# Patient Record
Sex: Female | Born: 1985 | Race: Black or African American | Hispanic: No | Marital: Married | Smoking: Never smoker
Health system: Southern US, Community
[De-identification: ages and names within clinical notes are randomized; demographics above are authoritative.]

## PROBLEM LIST (undated history)

## (undated) ENCOUNTER — Inpatient Hospital Stay (HOSPITAL_COMMUNITY): Payer: Self-pay

## (undated) DIAGNOSIS — O24419 Gestational diabetes mellitus in pregnancy, unspecified control: Secondary | ICD-10-CM

## (undated) DIAGNOSIS — M549 Dorsalgia, unspecified: Secondary | ICD-10-CM

## (undated) HISTORY — PX: CHOLECYSTECTOMY: SHX55

---
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
## 2006-02-07 ENCOUNTER — Inpatient Hospital Stay (HOSPITAL_COMMUNITY): Admission: AD | Admit: 2006-02-07 | Discharge: 2006-02-07 | Payer: Self-pay | Admitting: Gynecology

## 2006-03-19 ENCOUNTER — Inpatient Hospital Stay (HOSPITAL_COMMUNITY): Admission: AD | Admit: 2006-03-19 | Discharge: 2006-03-19 | Payer: Self-pay | Admitting: Gynecology

## 2006-03-19 ENCOUNTER — Ambulatory Visit: Payer: Self-pay | Admitting: Obstetrics and Gynecology

## 2006-03-23 ENCOUNTER — Ambulatory Visit: Payer: Self-pay | Admitting: Obstetrics & Gynecology

## 2006-03-29 ENCOUNTER — Inpatient Hospital Stay (HOSPITAL_COMMUNITY): Admission: AD | Admit: 2006-03-29 | Discharge: 2006-03-29 | Payer: Self-pay | Admitting: Family Medicine

## 2006-03-29 ENCOUNTER — Ambulatory Visit: Payer: Self-pay | Admitting: *Deleted

## 2006-03-30 ENCOUNTER — Inpatient Hospital Stay (HOSPITAL_COMMUNITY): Admission: AD | Admit: 2006-03-30 | Discharge: 2006-04-04 | Payer: Self-pay | Admitting: Gynecology

## 2006-03-30 ENCOUNTER — Ambulatory Visit: Payer: Self-pay | Admitting: Obstetrics & Gynecology

## 2006-03-31 ENCOUNTER — Encounter (INDEPENDENT_AMBULATORY_CARE_PROVIDER_SITE_OTHER): Payer: Self-pay | Admitting: Specialist

## 2006-04-08 ENCOUNTER — Ambulatory Visit: Payer: Self-pay | Admitting: Gynecology

## 2006-05-13 ENCOUNTER — Inpatient Hospital Stay (HOSPITAL_COMMUNITY): Admission: EM | Admit: 2006-05-13 | Discharge: 2006-05-17 | Payer: Self-pay | Admitting: Emergency Medicine

## 2006-05-15 ENCOUNTER — Encounter (INDEPENDENT_AMBULATORY_CARE_PROVIDER_SITE_OTHER): Payer: Self-pay | Admitting: Specialist

## 2006-12-03 ENCOUNTER — Emergency Department (HOSPITAL_COMMUNITY): Admission: EM | Admit: 2006-12-03 | Discharge: 2006-12-03 | Payer: Self-pay | Admitting: Emergency Medicine

## 2007-06-28 ENCOUNTER — Emergency Department (HOSPITAL_COMMUNITY): Admission: EM | Admit: 2007-06-28 | Discharge: 2007-06-28 | Payer: Self-pay | Admitting: Emergency Medicine

## 2008-07-04 ENCOUNTER — Emergency Department (HOSPITAL_COMMUNITY): Admission: EM | Admit: 2008-07-04 | Discharge: 2008-07-05 | Payer: Self-pay | Admitting: Emergency Medicine

## 2008-08-13 ENCOUNTER — Inpatient Hospital Stay (HOSPITAL_COMMUNITY): Admission: AD | Admit: 2008-08-13 | Discharge: 2008-08-13 | Payer: Self-pay | Admitting: Obstetrics & Gynecology

## 2008-09-14 ENCOUNTER — Ambulatory Visit (HOSPITAL_COMMUNITY): Admission: RE | Admit: 2008-09-14 | Discharge: 2008-09-14 | Payer: Self-pay | Admitting: Obstetrics and Gynecology

## 2008-10-28 ENCOUNTER — Inpatient Hospital Stay (HOSPITAL_COMMUNITY): Admission: AD | Admit: 2008-10-28 | Discharge: 2008-10-28 | Payer: Self-pay | Admitting: Obstetrics & Gynecology

## 2008-10-28 ENCOUNTER — Ambulatory Visit: Payer: Self-pay | Admitting: Family

## 2009-02-08 ENCOUNTER — Ambulatory Visit: Payer: Self-pay | Admitting: Obstetrics & Gynecology

## 2009-02-08 ENCOUNTER — Inpatient Hospital Stay (HOSPITAL_COMMUNITY): Admission: AD | Admit: 2009-02-08 | Discharge: 2009-02-10 | Payer: Self-pay | Admitting: Obstetrics & Gynecology

## 2010-07-06 ENCOUNTER — Emergency Department (HOSPITAL_COMMUNITY)
Admission: EM | Admit: 2010-07-06 | Discharge: 2010-07-07 | Payer: Self-pay | Source: Home / Self Care | Admitting: Emergency Medicine

## 2010-07-07 NOTE — L&D Delivery Note (Signed)
Delivery Note At 8:09 PM a viable female was delivered via Vaginal, Spontaneous Delivery (Presentation: occiput anterior direct).  APGAR: 7 ,9 ; weight 9 lb 9.4 oz (4350 g).   Placenta status: delivered intact.  Cord: 3 vessel cord.  with the following complications: Mild shoulder dystocia due to transverse position of shoulders and fetal macrosomia.  [place] employed, shoulders released by delivery of anterior arm with posterior traction on elbow. Pubic rami obstructing release of shoulder, not pubic symphysis.   Anesthesia: Epidural  Episiotomy: none Lacerations: 1st degree perineal, no repair required.  Est. Blood Loss (mL): 250  Mom to postpartum.  Baby to nursery-stable.  Delivery attended by Dr. Emelda Fear.   Tana Conch 05/28/2011, 8:28 PM

## 2010-07-07 NOTE — L&D Delivery Note (Signed)
Deluivery attended and active assistance with shoulder dystocia performed, with good outcomes.

## 2010-09-16 LAB — DIFFERENTIAL
Basophils Relative: 0 % (ref 0–1)
Lymphocytes Relative: 49 % — ABNORMAL HIGH (ref 12–46)
Lymphs Abs: 3.6 10*3/uL (ref 0.7–4.0)
Monocytes Absolute: 0.5 10*3/uL (ref 0.1–1.0)
Neutrophils Relative %: 41 % — ABNORMAL LOW (ref 43–77)

## 2010-09-16 LAB — URINE MICROSCOPIC-ADD ON

## 2010-09-16 LAB — COMPREHENSIVE METABOLIC PANEL
CO2: 25 mEq/L (ref 19–32)
Chloride: 108 mEq/L (ref 96–112)
Creatinine, Ser: 0.57 mg/dL (ref 0.4–1.2)
GFR calc Af Amer: >60 mL/min (ref >60)
GFR calc non Af Amer: >60 mL/min (ref >60)
Glucose, Bld: 93 mg/dL (ref 70–99)
Sodium: 140 mEq/L (ref 135–145)
Total Bilirubin: 0.8 mg/dL (ref 0.3–1.2)

## 2010-09-16 LAB — URINALYSIS, ROUTINE W REFLEX MICROSCOPIC
Bilirubin Urine: NEGATIVE
Leukocytes, UA: NEGATIVE
Nitrite: NEGATIVE
Protein, ur: NEGATIVE mg/dL
Urobilinogen, UA: 1 mg/dL (ref 0.0–1.0)

## 2010-09-16 LAB — CBC: MCH: 28.2 pg (ref 26.0–34.0)

## 2010-10-12 LAB — CBC
MCV: 88.8 fL (ref 78.0–100.0)
Platelets: 232 10*3/uL (ref 150–400)
RBC: 4.27 MIL/uL (ref 3.87–5.11)

## 2010-10-12 LAB — RPR: RPR Ser Ql: NONREACTIVE

## 2010-10-31 ENCOUNTER — Inpatient Hospital Stay (HOSPITAL_COMMUNITY)
Admission: AD | Admit: 2010-10-31 | Discharge: 2010-10-31 | Disposition: A | Discharge status: Home / Self Care | Payer: Medicaid Other | Source: Ambulatory Visit | Attending: Obstetrics & Gynecology | Admitting: Obstetrics & Gynecology

## 2010-10-31 DIAGNOSIS — O210 Mild hyperemesis gravidarum: Secondary | ICD-10-CM | POA: Insufficient documentation

## 2010-10-31 LAB — URINALYSIS, ROUTINE W REFLEX MICROSCOPIC
Bilirubin Urine: NEGATIVE
Hgb urine dipstick: NEGATIVE
Specific Gravity, Urine: >1.03 — ABNORMAL HIGH (ref 1.005–1.030)
pH: 7 (ref 5.0–8.0)

## 2010-11-06 ENCOUNTER — Other Ambulatory Visit: Payer: Self-pay | Admitting: Family Medicine

## 2010-11-06 DIAGNOSIS — Z3689 Encounter for other specified antenatal screening: Secondary | ICD-10-CM

## 2010-11-06 LAB — OB RESULTS CONSOLE VARICELLA ZOSTER ANTIBODY, IGG: Varicella: IMMUNE

## 2010-11-06 LAB — SICKLE CELL SCREEN: SICKLE CELL SCREEN: NORMAL

## 2010-11-18 ENCOUNTER — Inpatient Hospital Stay (HOSPITAL_COMMUNITY)
Admission: AD | Admit: 2010-11-18 | Discharge: 2010-11-18 | Disposition: A | Discharge status: Home / Self Care | Payer: Medicaid Other | Source: Ambulatory Visit | Attending: Family Medicine | Admitting: Family Medicine

## 2010-11-18 DIAGNOSIS — O210 Mild hyperemesis gravidarum: Secondary | ICD-10-CM | POA: Insufficient documentation

## 2010-11-18 LAB — URINALYSIS, ROUTINE W REFLEX MICROSCOPIC
Hgb urine dipstick: NEGATIVE
Protein, ur: NEGATIVE mg/dL
Specific Gravity, Urine: >1.03 — ABNORMAL HIGH (ref 1.005–1.030)
Urobilinogen, UA: 0.2 mg/dL (ref 0.0–1.0)
pH: 8 (ref 5.0–8.0)

## 2010-11-22 NOTE — Op Note (Signed)
NAME:  [name], [name] NO.:  [id number]   MEDICAL RECORD NO.:  [id number]          PATIENT TYPE:  INP   LOCATION:  5735                         FACILITY:  [facility]   PHYSICIAN:  Angelia Mould. Derrell Lolling, M.D.DATE OF BIRTH:  Apr 21, 1986   DATE OF PROCEDURE:  05/15/2006  DATE OF DISCHARGE:                                 OPERATIVE REPORT   PREOPERATIVE DIAGNOSIS:  Gallstone pancreatitis.   POSTOPERATIVE DIAGNOSIS:  Gallstone pancreatitis, suspect  choledocholithiasis.   OPERATION PERFORMED:  Laparoscopic cholecystectomy with intraoperative  cholangiogram.   SURGEON:  Dr. Claud Kelp   FIRST ASSISTANT:  Dr. Violeta Gelinas   OPERATIVE INDICATIONS:  This is a 25 year old African female who had a  recent cesarean section about 6 weeks ago.  She was admitted on November7  with abdominal pain, significantly elevated amylase, and elevated liver  function tests and gallstones.  She is admitted by Dr. Vida Rigger.  The  pancreatitis and her pain rapidly improved to the point where this morning  she was completely asymptomatic and had a nontender abdomen.  Her amylase  and lipase had come down greatly.  Liver function tests had come down  somewhat but are not completely normal.  We discussed algorithms for care  and decided to go ahead with cholecystectomy.   OPERATIVE FINDINGS:  The gallbladder was edematous and somewhat acutely  inflamed.  The cholangiogram showed normal intrahepatic and extrahepatic  biliary anatomy.  The cystic duct was quite long with a low insertion.  There were no filling defects that were obvious, but the contrast did not go  into the duodenum. The common duct did not drain despite a second  cholangiogram after giving some Glucagon.  A common bile duct stone could  not be ruled out.  The liver, stomach, duodenum, large intestine, small  intestine were grossly normal to inspection.  She had significant adhesions  in the lower midline from her previous  C-section.   OPERATIVE TECHNIQUE:  Following the induction of general endotracheal  anesthesia, the patient's abdomen was prepped and draped in a sterile  fashion.  Marcaine 0.5% with epinephrine was used as a local infiltration  anesthetic.  A vertically oriented incision was made in the lower rim of the  umbilicus.  The fascia was incised in the midline and the abdominal cavity  entered under direct vision.  A 10 mm Hassan trocar was inserted and secured  with a pursestring suture of 0 Vicryl.  Pneumoperitoneum was created.  Video  camera was inserted with visualization and findings as described above.  A  10-mm trocar was placed in the subxiphoid region.  Two 5 mm trocars were  placed in the right mid abdomen.  The gallbladder fundus was identified.  We  carefully took down lots of adhesions from the gallbladder until we could  identify the infundibulum.  We then retracted the infundibulum laterally.  We then dissected out the cystic duct and cystic artery.  The cystic artery  was isolated as it went onto the wall of the gallbladder, secured with  multiple metal clips, and divided.  We then created a large window behind  the cystic duct.  A cholangiogram catheter was inserted into the cystic  duct.  A cholangiogram was then obtained and showed normal anatomy, but  there was no drainage suggesting obstruction of the distal common bile duct,  otherwise normal biliary anatomy.  The cholangiogram catheter was removed.  The cystic duct was secured with multiple metal clips and divided.  The  gallbladder was dissected from its bed with electrocautery and removed  through the umbilical port.  The operative field was irrigated.  Hemostasis  was excellent.  At the completion of the case, there was no bleeding and no  bile leak whatsoever.  The trocars were removed under direct vision.  There  was no bleeding from the trocar sites.  Pneumoperitoneum was released.  The  fascia at the umbilicus was  closed with 0 Vicryl suture.  Skin incisions  were closed with subcuticular sutures of 4-0 Monocryl and Steri-Strips.  Clean bandages were placed and the patient taken to the recovery room in  stable condition.  Estimated blood loss was about 10 mL.  Complications  none.  Sponge and instrument counts were correct.      Angelia Mould. Derrell Lolling, M.D.  Electronically Signed     HMI/MEDQ  D:  05/15/2006  T:  05/15/2006  Job:  [id number]   cc:   [name], M.D.

## 2010-11-22 NOTE — Discharge Summary (Signed)
NAMEMarland Kitchen  MARSI, TURVEY NO.:  [id number]   MEDICAL RECORD NO.:  [id number]          PATIENT TYPE:  INP   LOCATION:  9138                          [facility]AC[facility]  [facility]   PHYSICIAN:  Ginger Carne, MD  DATE OF BIRTH:  January 30, 1986   DATE OF ADMISSION:  03/30/2006  DATE OF DISCHARGE:  04/04/2006                               DISCHARGE SUMMARY   DELIVERY DATE:  March 31, 2006.   DISCHARGE DIAGNOSES:  1. Status post low transverse cesarean section for arrest of second      stage of labor.  2. Chorioamnionitis and endometritis.  3. Epigastric pain.  4. History of Trichomonas in March 2007 that was treated.   ADMIT HISTORY AND PHYSICAL:  The patient is a 25 year old female, G1,  who presented at 70 and 4/7 with contractions occurring every 5 minutes  who was admitted for spontaneous onset of labor with dilation from 2-3  cm to 3 cm after ambulating in the hall.   HOSPITAL COURSE:  #1.  Status post low transverse cesarean section. The  patient was admitted with spontaneous onset of labor after she was going  to have an induction of labor on the evening of admission. She required  Pitocin augmentation to ripen cervix and to dilate it and artifical  rupture of membranes for progression. The patient dilated to complete  and attempted to push but could not have any feedback because she had  epidural placed and then stated that it hurt too much to push as the  baby was occiput posterior position. She was consented for a C section  for arrest of the second stage of labor. She was taken back to the  operating room where she delivered a healthy female infant born at 9  pounds 8 ounces with a length of 54 cm. There was a three-vessel cord.  Cord pH was obtained. The patient's estimated blood loss was 1500 mL.  Her hemoglobin had dropped to 10.7 from 13.1 on the day after C section  but then it stabilized after that. She is rubella immune, Rh positive,  GBS negative. She is to  have sutures out at 5-7 days and have a 6 week  postpartum visit. The C section was complicated by uterine atony and  required uterine massage.  #2.  Chorioamnionitis and endometritis. The patient was noted to have a  temperature of up to 100.9 and the baby's fetal heart rate went from a  baseline of 140s to baseline of 155 so __________  and clindamycin were  started. She was monitored through the few days postpartum and was noted  to spike a temperature on postoperative day #1 and also have a  temperature on postoperative day #2 but was noted to be afebrile in the  48-hours leading up to discharge. She was discontinued on her IV  antibiotics at that time and discharged on Augmentin 500 mg b.i.d. x10  days. Blood cultures were drawn and had no growth. Urine cultures also  had no growth.  #3.  Epigastric pain. The patient was noted to have epigastric pain on  postoperative day #3. She had had  a bowel movement and was urinating. A  CMP, CBC, lipase, and amylase were ordered with repeat serial abdominal  exams. __________ considered right upper quadrant ultrasound or a CT.  CMP, lipase and amylase were within normal limits. She did not have a  left shift on her CBC. She was without rebound or guarding later in  postoperative day #3 and this seemed to resolve on its own.   DISCHARGE MEDICATIONS:  1. Augmentin 500 mg b.i.d. x10 days.  2. Micronor for contraception.  3. Prenatal vitamin to take while breast feeding.  4. Colace 100 mg p.r.n. daily for constipation.   CONDITION ON DISCHARGE:  Good, stable.     ______________________________  Norton Blizzard, M.D.      Ginger Carne, MD  Electronically Signed    SH/MEDQ  D:  06/11/2006  T:  06/11/2006  Job:  [id number]

## 2010-11-22 NOTE — Discharge Summary (Signed)
NAME:  [name], [name] NO.:  [id number]   MEDICAL RECORD NO.:  [id number]          PATIENT TYPE:  INP   LOCATION:  5735                         FACILITY:  [facility]   PHYSICIAN:  Angelia Mould. Derrell Lolling, M.D.DATE OF BIRTH:  Feb 22, 1986   DATE OF ADMISSION:  05/13/2006  DATE OF DISCHARGE:  05/17/2006                               DISCHARGE SUMMARY   FINAL DIAGNOSES:  1. Gallstone pancreatitis.  2. Chronic cholecystitis with cholelithiasis, pathology report      pending.  3. Status post cesarean section 6 months prior.   OPERATIONS PERFORMED:  Laparoscopic cholecystectomy with intraoperative  cholangiogram, date May 15, 2006.   HISTORY:  This is a 25 year old female who presented to the emergency  room with abdominal pain, nausea and vomiting.  Workup revealed  hemoglobin 12.5, blood cell count 7900, SGOT 56, SGPT 43, total  bilirubin 2.7 and lipase greater than 2000.  She was thought to have  gallstone pancreatitis.  Ultrasound demonstrated gallstones.  The  emergency department physician called Dr. Jimmye Norman who evaluated the  patient and admitted the patient.   PHYSICAL EXAMINATION:  Pleasant in mild distress, young woman.  Afebrile, pulse 73.  Sclera revealed no evidence of jaundice.  Abdomen was soft with some mild to moderate tenderness in the right  upper quadrant and a questionable palpable mass on the right side.  No  organomegaly.   HOSPITAL COURSE:  The patient was admitted by Dr. Jimmye Norman.  Dr.  Lindie Spruce call Dr. Evette Cristal to consider preoperative ERCP.  The patient was  admitted for further evaluation and management.   Dr. Vida Rigger followed up with the patient and felt that since her  symptoms were improving that he would observe the patient, and that if  her pain and liver function tests and amylase and lipase declined over  24 hours he would prefer [place] to go ahead with the cholecystectomy and  hold off on the ERCP unless necessary.   Over the next  couple days, the patient's pain did get much better.  The  amylase on May 14, 2006 was down to 442, and the total bilirubin was  down to 1.9.  She was kept n.p.o.  On November, the patient was pain  free and hungry, and the abdomen exam was unremarkable.  Laparoscopic  cholecystectomy with cholangiogram was performed on November 9.  The  common bile duct did not empty despite the use of glucagon, but no  filling defects were noted.  Cholecystectomy was uneventful.   Over the next couple of days, the patient was followed closely by Dr.  Carman Ching of the GI service as well as Dr. Leonie Man of [facility].  The patient did well.  Her liver function tests  declined.  She resumed diet.  Dr. Dorena Cookey saw her on May 17, 2006 and felt that since she was improving that he would hold off on  ERCP.  He stated that if the liver function tests do not normalize over  time that she would probably need a workup for chronic hepatitis B and  C.  The patient was  also seen by Dr. Leonie Man of [facility] who discharged her home with follow-up in our office in 2 weeks.      Angelia Mould. Derrell Lolling, M.D.  Electronically Signed     HMI/MEDQ  D:  07/24/2006  T:  07/24/2006  Job:  [id number]   cc:   Petra Kuba, M.D.

## 2010-11-22 NOTE — Op Note (Signed)
NAME:  [name], [name] NO.:  [id number]   MEDICAL RECORD NO.:  [id number]          PATIENT TYPE:  INP   LOCATION:  9138                          FACILITY:  [facility]   PHYSICIAN:  Lesly Dukes, M.D. DATE OF BIRTH:  11/18/1985   DATE OF PROCEDURE:  03/31/2006  DATE OF DISCHARGE:                                 OPERATIVE REPORT   The patient is a 25 year old G1 P0 female at 70 and 4 with arrest of second  stage, occiput posterior presentation, chorioamnionitis, and meconium.   POSTOPERATIVE DIAGNOSIS:  1. The patient is a 25 year old G1 P0 female at 91 and 4 with arrest of      second stage, occiput posterior presentation, chorioamnionitis, and      meconium.  2. Uterine atony.   PROCEDURE:  Primary low transverse cesarean section.   SURGEON:  Lesly Dukes, M.D.   ASSISTANT:  Ginger Carne, M.D.   ANESTHESIA:  Epidural.   PATHOLOGY:  Placenta to pathology.   ESTIMATED BLOOD LOSS:  1500.   COMPLICATIONS:  None.   FINDINGS:  A viable female infant.  Apgars 9 at one minute, 9 at five  minutes, weighing 9 pounds 8 ounces.  Deflexed occiput posterior  presentation.  Meconium.  Arterial blood gas 7.22.  Grossly normal uterus,  ovaries, and fallopian tubes.  Uterine atony that responded to Methergine,  Hemabate, and Cytotec.   PROCEDURE:  After informed consent was obtained, the patient was taken to  the operating room, where epidural anesthesia was found to be adequate.  The  patient was placed in the dorsal supine position with a leftward tilt and  prepared and draped in the normal sterile fashion.  A Foley was placed in  the bladder.  A Pfannenstiel skin incision was made with a scalpel and  carried down to the fascia.  The fascia was incised in the midline and  extended bilaterally.  The superior and inferior aspects of the fascial  incision were grasped with Kocher clamps, tented up, and dissected down  sharply and bluntly from the underlying  layers of rectus muscles.  Rectus  muscles were separated in the midline.  The peritoneum was entered bluntly,  and this incision was extended both superiorly and inferiorly, with good  visualization of the bladder.  The bladder blade was inserted.  The bladder  flap was then created, and the bladder blade was then reinserted.  Uterine  incision was made in a transverse fashion in the lower uterine segment and  was extended with the bandage scissors.  The baby's head delivered  atraumatically, and the nose and mouth were suctioned.  The rest of the  baby's body delivered easily.  Cord was clamped and cut, and the baby was  handed off to the awaiting pediatrician.  Arterial cord blood was sent for  blood gas.  Placenta delivered manually and was sent to pathology.  The  uterus was exteriorized and cleared out of all clots and debris.  Vigorous  massage and Pitocin were given.  Methergine was also given for uterine  atony.  The uterus did not respond, and Hemabate  was given.  More massage  was given, and eventually Cytotec was given in the rectum, and eventually  good uterine contraction was seen.  The uterine incision was closed with 0  Vicryl in a running-locked fashion, with good hemostasis.  The uterus was  returned to the abdomen and noted to be hemostatic off tension.  The  peritoneum and rectus muscles were noted to be hemostatic.  The fascia was  closed with 0 Vicryl in a running fashion and noted to be hemostatic.  Subcutaneous tissue was copiously irrigated and found to be hemostatic.  The  skin was closed with staples.  The patient tolerated the procedure well.  The sponge, lap, instrument, and needle counts were correct x2.  The patient  went to the recovery room in stable condition.  Antibiotics were given for  24 hours postpartum, given the chorioamnionitis.           ______________________________  Lesly Dukes, M.D.     KHL/MEDQ  D:  03/31/2006  T:  04/02/2006  Job:   [id number]

## 2010-11-22 NOTE — H&P (Signed)
NAME:  [name], [name] NO.:  [id number]   MEDICAL RECORD NO.:  [id number]          PATIENT TYPE:  WOC   LOCATION:  WOC                          FACILITY:  [facility]   PHYSICIAN:  Cherylynn Ridges, M.D.    DATE OF BIRTH:  Jul 28, 1985   DATE OF ADMISSION:  04/08/2006  DATE OF DISCHARGE:  04/08/2006                                HISTORY & PHYSICAL   IDENTIFICATION/CHIEF COMPLAINT:  The patient is a 25 year old female who has  been in the emergency room since 1 o'clock this afternoon with severe  abdominal pain and was found to have gallstone pancreatitis.   HISTORY OF PRESENT ILLNESS:  The patient was in her usual state of health.  Developed severe abdominal pain in mid epigastrium and right upper quadrant  associated with nausea and some vomiting.  She had no fevers or chills, but  the pain was 10/10 on her arrival to the emergency room.  She is afebrile.  Subsequent workup demonstrated her to have a hemoglobin 12.5, hematocrit  36.4, normal white blood cell count of 7.9, SGOT of 56, SGPT 43, alk phos  254, with total bilirubin of 2.7.  Lipase was over 2000.  She was found to  have gallstone pancreatitis.  An ultrasound demonstrated gallstones, and a  surgical consultation was obtained.   Dr. Evette Cristal contacted me when the gallstone pancreatitis was made because of  the elevated bilirubin and another elevated liver function test I thought it  was prudent to get a GI consultation and consider preoperative ERCP prior to  surgical intervention.  Dr. Vida Rigger came in to see the patient and  thought her symptoms are markedly improved, although she had received  parenteral narcotics.  Above that the likelihood that she had already passed  the stone and that she will likely not require an ERCP.  I came in to admit  the patient with the possibility of performing surgery tomorrow.   PAST MEDICAL HISTORY:  Unremarkable.   PAST SURGICAL HISTORY:  She had a C-section six months ago  and has a 1-month-  old little girl.   MEDICATIONS:  She takes no medications currently, outside of Tylenol, for  this pain.   ALLERGIES:  She has NO KNOWN DRUG ALLERGIES.   SOCIAL HISTORY:  She is a nonsmoker and nondrinker.  Does not take any  illegal drugs.   FAMILY HISTORY:  Noncontributory.   REVIEW OF SYSTEMS:  She has had nausea, vomiting, and abdominal pain.   PHYSICAL EXAMINATION:  GENERAL:  She is afebrile.  VITAL SIGNS:  Blood pressure 111/76.  Pulse of 73.  She has a normal O2 sat  of 100% on room air.  HEENT:  She is normocephalic, atraumatic, and nonicteric.  She does not have  any scleral jaundice.  NECK:  Supple.  CHEST:  Clear to auscultation.  CARDIAC:  Regular rhythm and rate without murmurs, gallops, rubs, or heaves.  ABDOMEN:  Soft with some mild-to-moderate tenderness in right upper quadrant  and a questionable palpable mass over on the right side.  No splenomegaly or  palpable spleen.  PELVIC AND RECTAL:  Not  performed.  NEUROLOGIC:  Cranial nerves II-XII are grossly intact.   LABORATORY DATA:  Checking her laboratory studies other than those I  mentioned previously, her electrolytes are within normal limits with normal  potassium.  BUN is 6 and creatinine of 0.5.   IMPRESSION:  Gallstone pancreatitis with the possibility of retained bile-  duct stone.  Because of the possibility of retained stone, I felt as though  it was a good idea to get a GI consult.  They felt as though if the LFTs  improved by tomorrow then an ERCP would not be necessary preoperatively.  We  will go ahead admit the patient on the service, repeat her labs tomorrow  morning, and hopefully get her gallbladder out tomorrow.      Cherylynn Ridges, M.D.  Electronically Signed     JOW/MEDQ  D:  05/13/2006  T:  05/14/2006  Job:  [id number]   cc:   Petra Kuba, M.D.

## 2010-11-22 NOTE — Consult Note (Signed)
NAME:  MAJESTIC, MOLONY NO.:  [id number]   MEDICAL RECORD NO.:  [id number]          PATIENT TYPE:  INP   LOCATION:  5735                         FACILITY:  [facility]   PHYSICIAN:  Petra Kuba, M.D.    DATE OF BIRTH:  1985/12/15   DATE OF CONSULTATION:  05/13/2006  DATE OF DISCHARGE:                                   CONSULTATION   HISTORY:  Patient seen at the request of the ER physician, Dr. Normajean Glasgow, for  presumed gallstone pancreatitis.  They had called the surgeon, Dr. Jimmye Norman, who had asked that we be called.  She had no previous GI history.  Did have a baby and C-section, although no preop ultrasound 6 weeks ago, has  been breastfeeding some, but yesterday began having abdominal pain right  upper quadrant, some radiation to her back with some nausea and vomiting and  possibly a low grade fever.  She did notice her urine was dark today, but  has had no previous GI problems or any other complaints.  She did present to  the emergency room.  Ultrasound did show gallstones with some cholecystitis  and some pericolic fluid.  Her CBD was slightly dilated.  Her pancreas  looked good on ultrasound.  Her lipase was greater than 2000.  Liver tests  elevated with a normal white count and although she is feeling better is  admitted for further workup and plan.   PAST MEDICAL HISTORY:  Is pertinent for this recent C-section as above only.   ALLERGIES:  NONE.   CURRENT MEDICATIONS:  Tylenol only.   SOCIAL HISTORY:  Rarely drinks.  Does not smoke.  Does not use any other  over-the-counter medicines.   FAMILY HISTORY:  Negative for any GI problems or gallbladder problems.   REVIEW OF SYSTEMS:  Pertinent for being hungry, wants to eat.  She has lost  some weight since her pregnancy, but really has not quantitated it.   PHYSICAL EXAMINATION:  VITAL SIGNS:  See chart.  She is afebrile.  GENERAL:  In no acute distress.  LUNGS:  Clear.  HEART:  Regular rate and rhythm.  ABDOMEN:  Soft.  She is not tender currently.   Urinalysis pertinent for a moderate amount of bilirubin.  Specific gravity  normal, 3 to 6 white cells, nitrates and leukocyte esterase negative.  White  count normal, hemoglobin normal, platelet count normal with a normal  differential.   LABS:  Pertinent for a normal BUN and creatinine.  Normal protein and  albumin.  SGOT 560, SGPT 403, alkaline phosphatase 254, total bili 2.7 with  a lipase greater than 2000.  Ultrasound reviewed with Dr. Eppie Gibson, pertinent  for above findings.   ASSESSMENT:  1. Recent C-section.  2. Gallstone pancreatitis.  3. Acute cholecystitis on the ultrasound.   PLAN:  I believe she has passed the stone based on feeling better, hungry  and nontender and her pancreas looking good on ultrasound, tells me that she  might be a good candidate, that if her labs and exam are even better  tomorrow, possibly a quick lap chole with intraoperative cholangiogram might  be in her best interests.  We have discussed pre- versus postop ERCP with  the patient.  She does not like the idea of something going through her  mouth.  We talked about how she would need surgery and I have asked a  surgeon to come see her.  Certainly, if her liver tests are increasing,  there are signs of cholangitis, worsening pancreatitis, may need preop ERCP  first.  Dr. Bosie Clos from our service to check on in the a.m. and we can  discuss how to proceed further.  Either happy to admit to our service or if  Dr. Lindie Spruce, we can admit them to their service and I believe Dr. Derrell Lolling is  their hospital doctor and we can get his opinion in the a.m. as to how to  proceed.           ______________________________  Petra Kuba, M.D.     MEM/MEDQ  D:  05/13/2006  T:  05/14/2006  Job:  [id number]   cc:   Cherylynn Ridges, M.D.

## 2010-12-25 ENCOUNTER — Ambulatory Visit (HOSPITAL_COMMUNITY)
Admission: RE | Admit: 2010-12-25 | Discharge: 2010-12-25 | Disposition: A | Discharge status: Home / Self Care | Payer: Medicaid Other | Source: Ambulatory Visit | Attending: Family Medicine | Admitting: Family Medicine

## 2010-12-25 DIAGNOSIS — Z3689 Encounter for other specified antenatal screening: Secondary | ICD-10-CM

## 2010-12-25 DIAGNOSIS — Z363 Encounter for antenatal screening for malformations: Secondary | ICD-10-CM | POA: Insufficient documentation

## 2010-12-25 DIAGNOSIS — Z1389 Encounter for screening for other disorder: Secondary | ICD-10-CM | POA: Insufficient documentation

## 2010-12-25 DIAGNOSIS — O358XX0 Maternal care for other (suspected) fetal abnormality and damage, not applicable or unspecified: Secondary | ICD-10-CM | POA: Insufficient documentation

## 2011-04-11 LAB — URINE CULTURE: Colony Count: 45000

## 2011-04-11 LAB — URINE MICROSCOPIC-ADD ON

## 2011-04-11 LAB — URINALYSIS, ROUTINE W REFLEX MICROSCOPIC
Ketones, ur: NEGATIVE mg/dL
Urobilinogen, UA: 1 mg/dL (ref 0.0–1.0)
pH: 8 (ref 5.0–8.0)

## 2011-05-23 ENCOUNTER — Other Ambulatory Visit (HOSPITAL_COMMUNITY): Payer: Self-pay | Admitting: Nurse Practitioner

## 2011-05-23 DIAGNOSIS — O480 Post-term pregnancy: Secondary | ICD-10-CM

## 2011-05-28 ENCOUNTER — Ambulatory Visit (HOSPITAL_COMMUNITY): Payer: Medicaid Other

## 2011-05-28 ENCOUNTER — Encounter (HOSPITAL_COMMUNITY): Payer: Self-pay | Admitting: Anesthesiology

## 2011-05-28 ENCOUNTER — Inpatient Hospital Stay (HOSPITAL_COMMUNITY): Payer: Medicaid Other | Admitting: Anesthesiology

## 2011-05-28 ENCOUNTER — Encounter (HOSPITAL_COMMUNITY): Payer: Self-pay

## 2011-05-28 ENCOUNTER — Inpatient Hospital Stay (HOSPITAL_COMMUNITY)
Admission: AD | Admit: 2011-05-28 | Discharge: 2011-05-30 | DRG: 775 | Disposition: A | Discharge status: Home / Self Care | Payer: Medicaid Other | Source: Ambulatory Visit | Attending: Obstetrics and Gynecology | Admitting: Obstetrics and Gynecology

## 2011-05-28 LAB — CBC
HCT: 37 % (ref 36.0–46.0)
Hemoglobin: 12.9 g/dL (ref 12.0–15.0)
MCH: 30.4 pg (ref 26.0–34.0)
MCHC: 34.9 g/dL (ref 30.0–36.0)
MCV: 87.1 fL (ref 78.0–100.0)
Platelets: 248 K/uL (ref 150–400)
RBC: 4.25 MIL/uL (ref 3.87–5.11)
RDW: 14.3 % (ref 11.5–15.5)
WBC: 6.1 K/uL (ref 4.0–10.5)

## 2011-05-28 LAB — ABO/RH: RH Type: POSITIVE

## 2011-05-28 LAB — ANTIBODY SCREEN: Antibody Screen: NEGATIVE

## 2011-05-28 LAB — HEPATITIS B SURFACE ANTIGEN: Hepatitis B Surface Ag: NEGATIVE

## 2011-05-28 LAB — RPR: RPR Ser Ql: NONREACTIVE

## 2011-05-28 LAB — RUBELLA ANTIBODY, IGM: Rubella: IMMUNE

## 2011-05-28 MED ORDER — PRENATAL PLUS 27-1 MG PO TABS
1.0000 | ORAL_TABLET | Freq: Every day | ORAL | Status: DC
  Administered 2011-05-29 – 2011-05-30 (×2): 1 via ORAL
  Filled 2011-05-28 (×2): qty 1

## 2011-05-28 MED ORDER — DIPHENHYDRAMINE HCL 25 MG PO CAPS: 25.0000 mg | ORAL_CAPSULE | Freq: Four times a day (QID) | ORAL | Status: DC | PRN

## 2011-05-28 MED ORDER — BENZOCAINE-MENTHOL 20-0.5 % EX AERO
1.0000 "application " | INHALATION_SPRAY | CUTANEOUS | Status: DC | PRN
  Filled 2011-05-28: qty 56

## 2011-05-28 MED ORDER — LACTATED RINGERS IV SOLN: 500.0000 mL | Freq: Once | INTRAVENOUS | Status: DC

## 2011-05-28 MED ORDER — SIMETHICONE 80 MG PO CHEW: 80.0000 mg | CHEWABLE_TABLET | ORAL | Status: DC | PRN

## 2011-05-28 MED ORDER — LACTATED RINGERS IV SOLN: 500.0000 mL | INTRAVENOUS | Status: DC | PRN

## 2011-05-28 MED ORDER — ACETAMINOPHEN 325 MG PO TABS: 650.0000 mg | ORAL_TABLET | ORAL | Status: DC | PRN

## 2011-05-28 MED ORDER — OXYTOCIN 10 UNIT/ML IJ SOLN
INTRAMUSCULAR | Status: AC
  Filled 2011-05-28: qty 2

## 2011-05-28 MED ORDER — ONDANSETRON HCL 4 MG PO TABS: 4.0000 mg | ORAL_TABLET | ORAL | Status: DC | PRN

## 2011-05-28 MED ORDER — NALBUPHINE SYRINGE 5 MG/0.5 ML: 10.0000 mg | INJECTION | INTRAMUSCULAR | Status: DC | PRN

## 2011-05-28 MED ORDER — PHENYLEPHRINE 40 MCG/ML (10ML) SYRINGE FOR IV PUSH (FOR BLOOD PRESSURE SUPPORT): 80.0000 ug | PREFILLED_SYRINGE | INTRAVENOUS | Status: DC | PRN

## 2011-05-28 MED ORDER — IBUPROFEN 600 MG PO TABS: 600.0000 mg | ORAL_TABLET | Freq: Four times a day (QID) | ORAL | Status: DC | PRN

## 2011-05-28 MED ORDER — OXYCODONE-ACETAMINOPHEN 5-325 MG PO TABS: 2.0000 | ORAL_TABLET | ORAL | Status: DC | PRN

## 2011-05-28 MED ORDER — OXYCODONE-ACETAMINOPHEN 5-325 MG PO TABS
1.0000 | ORAL_TABLET | ORAL | Status: DC | PRN
  Administered 2011-05-28 – 2011-05-30 (×5): 1 via ORAL
  Filled 2011-05-28: qty 2
  Filled 2011-05-28 (×4): qty 1

## 2011-05-28 MED ORDER — FLEET ENEMA 7-19 GM/118ML RE ENEM: 1.0000 | ENEMA | RECTAL | Status: DC | PRN

## 2011-05-28 MED ORDER — WITCH HAZEL-GLYCERIN EX PADS: 1.0000 "application " | MEDICATED_PAD | CUTANEOUS | Status: DC | PRN

## 2011-05-28 MED ORDER — LIDOCAINE HCL 1.5 % IJ SOLN
INTRAMUSCULAR | Status: DC | PRN
  Administered 2011-05-28: 2 mL via INTRADERMAL
  Administered 2011-05-28 (×2): 5 mL via INTRADERMAL

## 2011-05-28 MED ORDER — LANOLIN HYDROUS EX OINT: TOPICAL_OINTMENT | CUTANEOUS | Status: DC | PRN

## 2011-05-28 MED ORDER — PRENATAL PLUS 27-1 MG PO TABS: 1.0000 | ORAL_TABLET | Freq: Every day | ORAL | Status: DC

## 2011-05-28 MED ORDER — DIBUCAINE 1 % RE OINT
1.0000 "application " | TOPICAL_OINTMENT | RECTAL | Status: DC | PRN
  Filled 2011-05-28: qty 28

## 2011-05-28 MED ORDER — ZOLPIDEM TARTRATE 5 MG PO TABS: 5.0000 mg | ORAL_TABLET | Freq: Every evening | ORAL | Status: DC | PRN

## 2011-05-28 MED ORDER — IBUPROFEN 600 MG PO TABS
600.0000 mg | ORAL_TABLET | Freq: Four times a day (QID) | ORAL | Status: DC
  Administered 2011-05-28 – 2011-05-30 (×6): 600 mg via ORAL
  Filled 2011-05-28 (×6): qty 1

## 2011-05-28 MED ORDER — TETANUS-DIPHTH-ACELL PERTUSSIS 5-2.5-18.5 LF-MCG/0.5 IM SUSP
0.5000 mL | Freq: Once | INTRAMUSCULAR | Status: AC
  Administered 2011-05-29: 0.5 mL via INTRAMUSCULAR
  Filled 2011-05-28 (×2): qty 0.5

## 2011-05-28 MED ORDER — EPHEDRINE 5 MG/ML INJ
10.0000 mg | INTRAVENOUS | Status: DC | PRN
  Filled 2011-05-28: qty 4

## 2011-05-28 MED ORDER — DIPHENHYDRAMINE HCL 50 MG/ML IJ SOLN: 12.5000 mg | INTRAMUSCULAR | Status: DC | PRN

## 2011-05-28 MED ORDER — LACTATED RINGERS IV SOLN
INTRAVENOUS | Status: DC
  Administered 2011-05-28 (×2): via INTRAVENOUS

## 2011-05-28 MED ORDER — PHENYLEPHRINE 40 MCG/ML (10ML) SYRINGE FOR IV PUSH (FOR BLOOD PRESSURE SUPPORT)
80.0000 ug | PREFILLED_SYRINGE | INTRAVENOUS | Status: DC | PRN
  Filled 2011-05-28: qty 5

## 2011-05-28 MED ORDER — OXYTOCIN BOLUS FROM INFUSION
500.0000 mL | Freq: Once | INTRAVENOUS | Status: DC
  Filled 2011-05-28: qty 1000
  Filled 2011-05-28: qty 500

## 2011-05-28 MED ORDER — OXYTOCIN 20 UNITS IN LACTATED RINGERS INFUSION - SIMPLE: 125.0000 mL/h | Freq: Once | INTRAVENOUS | Status: DC

## 2011-05-28 MED ORDER — SENNOSIDES-DOCUSATE SODIUM 8.6-50 MG PO TABS
2.0000 | ORAL_TABLET | Freq: Every day | ORAL | Status: DC
  Administered 2011-05-29: 2 via ORAL

## 2011-05-28 MED ORDER — FENTANYL 2.5 MCG/ML BUPIVACAINE 1/10 % EPIDURAL INFUSION (WH - ANES)
14.0000 mL/h | INTRAMUSCULAR | Status: DC
  Administered 2011-05-28 (×2): 14 mL/h via EPIDURAL
  Filled 2011-05-28 (×2): qty 60

## 2011-05-28 MED ORDER — LIDOCAINE HCL (PF) 1 % IJ SOLN
30.0000 mL | INTRAMUSCULAR | Status: DC | PRN
  Filled 2011-05-28: qty 30

## 2011-05-28 MED ORDER — ONDANSETRON HCL 4 MG/2ML IJ SOLN: 4.0000 mg | Freq: Four times a day (QID) | INTRAMUSCULAR | Status: DC | PRN

## 2011-05-28 MED ORDER — EPHEDRINE 5 MG/ML INJ: 10.0000 mg | INTRAVENOUS | Status: DC | PRN

## 2011-05-28 MED ORDER — CITRIC ACID-SODIUM CITRATE 334-500 MG/5ML PO SOLN: 30.0000 mL | ORAL | Status: DC | PRN

## 2011-05-28 MED ORDER — ONDANSETRON HCL 4 MG/2ML IJ SOLN: 4.0000 mg | INTRAMUSCULAR | Status: DC | PRN

## 2011-05-28 NOTE — Anesthesia Preprocedure Evaluation (Signed)
Anesthesia Evaluation  Patient identified by MRN, date of birth, ID band Patient awake    Reviewed: Allergy & Precautions, H&P , NPO status , Patient's Chart, lab work & pertinent test results, reviewed documented beta blocker date and time   History of Anesthesia Complications Negative for: history of anesthetic complications  Airway Mallampati: II TM Distance: >3 FB Neck ROM: full    Dental  (+) Teeth Intact   Pulmonary neg pulmonary ROS,  clear to auscultation        Cardiovascular neg cardio ROS regular Normal    Neuro/Psych Negative Neurological ROS  Negative Psych ROS   GI/Hepatic negative GI ROS, Neg liver ROS,   Endo/Other  Morbid obesity  Renal/GU negative Renal ROS     Musculoskeletal   Abdominal   Peds  Hematology negative hematology ROS (+)   Anesthesia Other Findings   Reproductive/Obstetrics (+) Pregnancy                           Anesthesia Physical Anesthesia Plan  ASA: II  Anesthesia Plan: Epidural   Post-op Pain Management:    Induction:   Airway Management Planned:   Additional Equipment:   Intra-op Plan:   Post-operative Plan:   Informed Consent: I have reviewed the patients History and Physical, chart, labs and discussed the procedure including the risks, benefits and alternatives for the proposed anesthesia with the patient or authorized representative who has indicated his/her understanding and acceptance.     Plan Discussed with:   Anesthesia Plan Comments:         Anesthesia Quick Evaluation

## 2011-05-28 NOTE — Anesthesia Procedure Notes (Signed)
Epidural Patient location during procedure: OB Start time: 05/28/2011 4:24 PM Reason for block: procedure for pain  Staffing Performed by: anesthesiologist   Preanesthetic Checklist Completed: patient identified, site marked, surgical consent, pre-op evaluation, timeout performed, IV checked, risks and benefits discussed and monitors and equipment checked  Epidural Patient position: sitting Prep: site prepped and draped and DuraPrep Patient monitoring: continuous pulse ox and blood pressure Approach: midline Injection technique: LOR air  Needle:  Needle type: Tuohy  Needle gauge: 17 G Needle length: 9 cm Needle insertion depth: 7 cm Catheter type: closed end flexible Catheter size: 19 Gauge Catheter at skin depth: 12 cm Test dose: negative  Assessment Events: blood not aspirated, injection not painful, no injection resistance, negative IV test and no paresthesia  Additional Notes Discussed risk of headache, infection, bleeding, nerve injury and failed or incomplete block.  Patient voices understanding and wishes to proceed.

## 2011-05-28 NOTE — Progress Notes (Signed)
[name] is a 25 y.o. G3P2002 at [redacted]w[redacted]d by ultrasound admitted for active labor  Subjective:   Objective: BP 117/99  Pulse 92  Temp(Src) 98.4 F (36.9 C) (Oral)  Resp 20  Ht 5\' 8"  (1.727 m)  Wt 120.203 kg (265 lb)  BMI 40.29 kg/m2  LMP 08/16/2010      FHT:  FHR: 135 bpm, variability: moderate,  accelerations:  Present,  decelerations:  Absent UC:   irregular, every 3-5  minutes SVE:   Dilation: 5 Effacement (%): 80 Station: -2 Exam by:: Artelia Laroche CNM  Labs: Lab Results  Component Value Date   WBC 6.1 05/28/2011   HGB 12.9 05/28/2011   HCT 37.0 05/28/2011   MCV 87.1 05/28/2011   PLT 248 05/28/2011    Assessment / Plan: Augmentation of labor, progressing well but slowly due to insufficient strength and frequency of contractions.  Labor: progressing slowly, Amniotomy Performed Preeclampsia:  n/a Fetal Wellbeing:  Category I Pain Control:  Labor support without medications I/D:  n/a Anticipated MOD:  NSVD  Sullivan Blasing 05/28/2011, 3:58 PM

## 2011-05-28 NOTE — H&P (Signed)
[name] 25 y.o. female  [phone] with a history of C-section due to LGA on 1st pregnancy, VBAC on 2nd pregnancy presenting at [redacted]w[redacted]d with spontaneous onset of labor.   Patient states she started to feel some contractions last night and nearly came in. Contractions got more regular and more intense throughout the night. Presented to doctor's appointment this morning and was found to be 5-6 cm dilated. She was having contractions approximately every 5 minutes. Sent to [facility] hospital for active labor.   Patient denies decreased fetal movement, blood from vagina, or rush of fluid. Occasional discharge within the last day.   Marland Kitchen History OB History    Grav Para Term Preterm Abortions TAB SAB Ect Mult Living   3 2 2  0 0 0 0 0 0 2     history of C-section due to LGA on 1st pregnancy, VBAC on 2nd pregnancy  History reviewed. No pertinent past medical history. except obesity.   meds-PNV Allergies-none.   Past Surgical History  Procedure Date  . Gallbladder surgery 2007   Family History: family history is not on file. Noncontributory Social History:  reports that she has never smoked. She has never used smokeless tobacco. She reports that she does not drink alcohol or use illicit drugs.  ROS negative except as noted in HPI    Dilation: 7 Effacement (%): 80 Station: -2 Exam by:: Raliegh Ip RN Blood pressure 131/87, pulse 102, temperature 98.6 F (37 C), temperature source Oral, resp. rate 20, height 5\' 8"  (1.727 m), weight 120.203 kg (265 lb), last menstrual period 08/16/2010. Exam Physical Exam  Constitutional: She is oriented to person, place, and time. She appears well-developed and well-nourished.       obese  Cardiovascular: Normal rate and regular rhythm.  Exam reveals no gallop and no friction rub.   No murmur heard. Respiratory: Breath sounds normal. No respiratory distress. She has no wheezes. She has no rales. She exhibits no tenderness.  GI:       Gravid.  Size consistent with dates. Vertex presentation by Leopold's   Genitourinary: Vagina normal and uterus normal.  Musculoskeletal: Normal range of motion.       Able to walk without difficulty   Neurological: She is alert and oriented to person, place, and time.    Category I FHT-baseline 145, moderate variability, accels present, no decels. Contractions occasionally.   Prenatal labs: ABO, Rh: O/Positive/-- (11/21 1041) Antibody: Negative (11/21 1041) Rubella: Immune (11/21 0000) RPR: Nonreactive (11/21 0000)  HBsAg: Negative (11/21 0000)  HIV: Non-reactive (11/21 0000)  GBS: Negative (11/21 0000)   Assessment/Plan: #1 [id number]  at [redacted]w[redacted]d with spontaneous onset of labor. GBS negative. ID-negative.  #2 active labor #3 planned VBAC #4 history of LGA in 1st pregnancy but not in 2nd.  #5 reassuring fetal heart tones  Expect SVD/VBAC. Will encourage patient to ambulate and will give intermittent auscultation. Will consider artificial rupture of membrane's. Will also consider pitocin.    Tana Conch, MD, PGY1  05/28/2011, 12:05 PM

## 2011-05-28 NOTE — Progress Notes (Signed)
Pt transferred to room 124 via wheelchair, fob at side

## 2011-05-29 MED ORDER — BENZOCAINE-MENTHOL 20-0.5 % EX AERO
INHALATION_SPRAY | CUTANEOUS | Status: AC
  Administered 2011-05-29: 07:00:00
  Filled 2011-05-29: qty 56

## 2011-05-29 NOTE — Progress Notes (Signed)
Post Partum Day 1 SVD, shoulder dystocia (mild) Subjective: up ad lib, voiding, tolerating PO and + flatus  Objective: Blood pressure 120/75, pulse 93, temperature 98.3 F (36.8 C), temperature source Oral, resp. rate 18, height 5\' 8"  (1.727 m), weight 120.203 kg (265 lb), last menstrual period 08/16/2010, SpO2 96.00%, unknown if currently breastfeeding.  Physical Exam:  General: alert, cooperative and no distress Lochia: appropriate Uterine Fundus: firm  DVT Evaluation: No cords or calf tenderness. No significant calf/ankle edema.   Basename 05/28/11 1000  HGB 12.9  HCT 37.0    Assessment/Plan: Plan for discharge tomorrow, Breastfeeding and Contraception paraguard Plan for circumcision at [facility].    LOS: 1 day   [name] 05/29/2011, 7:12 AM

## 2011-05-29 NOTE — Anesthesia Postprocedure Evaluation (Signed)
  Anesthesia Post-op Note  Patient: [name]  Procedure(s) Performed: * No procedures listed *  Patient Location: Mother/Baby  Anesthesia Type: Epidural  Level of Consciousness: alert  and oriented  Airway and Oxygen Therapy: Patient Spontanous Breathing  Post-op Pain: mild  Post-op Assessment: Patient's Cardiovascular Status Stable and Respiratory Function Stable  Post-op Vital Signs: stable  Complications: No apparent anesthesia complications

## 2011-05-30 MED ORDER — IBUPROFEN 600 MG PO TABS: 600.0000 mg | ORAL_TABLET | Freq: Four times a day (QID) | ORAL | Status: AC

## 2011-05-30 NOTE — Progress Notes (Signed)
Post Partum Day 2 Subjective: no complaints, up ad lib, voiding and tolerating PO  Objective: Blood pressure 113/78, pulse 87, temperature 97.7 F (36.5 C), temperature source Oral, resp. rate 18, height 5\' 8"  (1.727 m), weight 120.203 kg (265 lb), last menstrual period 08/16/2010, SpO2 96.00%, unknown if currently breastfeeding.  Physical Exam:  General: alert, cooperative and no distress Heart: regular rate, no murmur Lungs: clear to auscultation bilaterally, no wheezing. Lochia: appropriate Uterine Fundus: firm DVT Evaluation: No evidence of DVT seen on physical exam.   Basename 05/28/11 1000  HGB 12.9  HCT 37.0    Assessment/Plan: Discharge home, Breastfeeding and Contraception paraguard   LOS: 2 days   Bryler Dibble JEHIEL 05/30/2011, 8:58 AM

## 2011-05-30 NOTE — Progress Notes (Signed)
UR Chart review completed.  

## 2011-05-30 NOTE — Discharge Summary (Signed)
Obstetric Discharge Summary Reason for Admission: onset of labor Prenatal Procedures: none Intrapartum Procedures: spontaneous vaginal delivery Postpartum Procedures: none Complications-Operative and Postpartum: First degree perineal laceration Hemoglobin  Date Value Range Status  05/28/2011 12.9  12.0-15.0 (g/dL) Final     HCT  Date Value Range Status  05/28/2011 37.0  36.0-46.0 (%) Final    Discharge Diagnoses: Term Pregnancy-delivered  Discharge Information: Date: 05/30/2011 Activity: pelvic rest Diet: routine Medications: PNV and Ibuprofen Condition: stable Instructions: refer to practice specific booklet Discharge to: home Follow-up Information    Follow up with [facility] [place]. Make an appointment in 6 weeks. (for postpartum visit)    Contact information:   [address] [place] [address]          Newborn Data: Live born female  Birth Weight: 9 lb 9.4 oz (4349 g) APGAR: 7, 9  Home with mother.  Jaceon Heiberger JEHIEL 05/30/2011, 8:56 AM

## 2011-06-03 ENCOUNTER — Ambulatory Visit (HOSPITAL_COMMUNITY): Payer: Medicaid Other

## 2011-10-12 ENCOUNTER — Other Ambulatory Visit: Payer: Self-pay

## 2011-10-12 ENCOUNTER — Emergency Department (HOSPITAL_COMMUNITY)
Admission: EM | Admit: 2011-10-12 | Discharge: 2011-10-13 | Disposition: A | Discharge status: Home / Self Care | Payer: Self-pay | Attending: Emergency Medicine | Admitting: Emergency Medicine

## 2011-10-12 ENCOUNTER — Encounter (HOSPITAL_COMMUNITY): Payer: Self-pay | Admitting: Emergency Medicine

## 2011-10-12 DIAGNOSIS — R059 Cough, unspecified: Secondary | ICD-10-CM | POA: Insufficient documentation

## 2011-10-12 DIAGNOSIS — R220 Localized swelling, mass and lump, head: Secondary | ICD-10-CM | POA: Insufficient documentation

## 2011-10-12 DIAGNOSIS — J3489 Other specified disorders of nose and nasal sinuses: Secondary | ICD-10-CM | POA: Insufficient documentation

## 2011-10-12 DIAGNOSIS — B9789 Other viral agents as the cause of diseases classified elsewhere: Secondary | ICD-10-CM | POA: Insufficient documentation

## 2011-10-12 DIAGNOSIS — R51 Headache: Secondary | ICD-10-CM | POA: Insufficient documentation

## 2011-10-12 DIAGNOSIS — R070 Pain in throat: Secondary | ICD-10-CM | POA: Insufficient documentation

## 2011-10-12 DIAGNOSIS — IMO0001 Reserved for inherently not codable concepts without codable children: Secondary | ICD-10-CM | POA: Insufficient documentation

## 2011-10-12 DIAGNOSIS — R509 Fever, unspecified: Secondary | ICD-10-CM | POA: Insufficient documentation

## 2011-10-12 DIAGNOSIS — R05 Cough: Secondary | ICD-10-CM | POA: Insufficient documentation

## 2011-10-12 DIAGNOSIS — R599 Enlarged lymph nodes, unspecified: Secondary | ICD-10-CM | POA: Insufficient documentation

## 2011-10-12 DIAGNOSIS — B349 Viral infection, unspecified: Secondary | ICD-10-CM

## 2011-10-12 DIAGNOSIS — R221 Localized swelling, mass and lump, neck: Secondary | ICD-10-CM | POA: Insufficient documentation

## 2011-10-12 NOTE — ED Notes (Signed)
C/o sore throat, fever, chest pain when coughing, productive cough with blood tinged sputum, and blood when she blows her nose since Tuesday.

## 2011-10-13 ENCOUNTER — Emergency Department (HOSPITAL_COMMUNITY): Payer: Self-pay

## 2011-10-13 NOTE — ED Provider Notes (Signed)
History     CSN: [id number]  Arrival date & time 10/12/11  2104   First MD Initiated Contact with Patient 10/13/11 0015      Chief Complaint  Patient presents with  . Sore Throat    (Consider location/radiation/quality/duration/timing/severity/associated sxs/prior treatment) HPI Comments: Patient with presents with cough, ST, subjective fever, rhinorrhea and congestion for the past 4 days.    Patient is a 26 y.o. female presenting with pharyngitis and cough. The history is provided by the patient.  Sore Throat This is a new problem. Episode onset: four days ago. The problem occurs constantly. The problem has been gradually worsening. Associated symptoms include chills, congestion, coughing, a fever, headaches, myalgias and a sore throat. Pertinent negatives include no abdominal pain, change in bowel habit, chest pain, diaphoresis, nausea, neck pain, rash, vertigo or vomiting. The symptoms are aggravated by swallowing. She has tried NSAIDs for the symptoms.  Cough This is a new problem. Episode onset: 4 days ago. The cough is productive of sputum. Associated symptoms include chills, headaches, rhinorrhea, sore throat and myalgias. Pertinent negatives include no chest pain, no ear pain, no shortness of breath and no wheezing. She is not a smoker. Her past medical history does not include asthma.    History reviewed. No pertinent past medical history.  Past Surgical History  Procedure Date  . Gallbladder surgery 2007  . Cholecystectomy     No family history on file.  History  Substance Use Topics  . Smoking status: Never Smoker   . Smokeless tobacco: Never Used  . Alcohol Use: No    OB History    Grav Para Term Preterm Abortions TAB SAB Ect Mult Living   3 3 3  0 0 0 0 0 0 3      Review of Systems  Constitutional: Positive for fever and chills. Negative for diaphoresis.  HENT: Positive for congestion, sore throat and rhinorrhea. Negative for ear pain and neck pain.     Respiratory: Positive for cough. Negative for shortness of breath and wheezing.   Cardiovascular: Negative for chest pain and leg swelling.  Gastrointestinal: Negative for nausea, vomiting, abdominal pain and change in bowel habit.  Musculoskeletal: Positive for myalgias.  Skin: Negative for rash.  Neurological: Positive for headaches. Negative for dizziness, vertigo, syncope and light-headedness.    Allergies  Review of patient's allergies indicates no known allergies.  Home Medications   Current Outpatient Rx  Name Route Sig Dispense Refill  . ACETAMINOPHEN 325 MG PO TABS Oral Take 650 mg by mouth every 6 (six) hours as needed. headache     . IBUPROFEN 400 MG PO TABS Oral Take 400 mg by mouth every 6 (six) hours as needed. For pain    . PRENATAL PLUS 27-1 MG PO TABS Oral Take 1 tablet by mouth daily.        BP 116/78  Pulse 108  Temp(Src) 98.5 F (36.9 C) (Oral)  Resp 18  SpO2 97%  Breastfeeding? Yes  Physical Exam  Nursing note and vitals reviewed. Constitutional: She appears well-developed and well-nourished. No distress.  HENT:  Head: Normocephalic and atraumatic. No trismus in the jaw.  Right Ear: Hearing, tympanic membrane, external ear and ear canal normal.  Left Ear: Hearing, tympanic membrane, external ear and ear canal normal.  Nose: Mucosal edema and rhinorrhea present. Right sinus exhibits no maxillary sinus tenderness and no frontal sinus tenderness. Left sinus exhibits no maxillary sinus tenderness and no frontal sinus tenderness.  Mouth/Throat: Uvula is midline  and mucous membranes are normal. No uvula swelling. Posterior oropharyngeal erythema present. No oropharyngeal exudate, posterior oropharyngeal edema or tonsillar abscesses.  Eyes: EOM are normal. Pupils are equal, round, and reactive to light.  Neck: Normal range of motion. Neck supple.  Cardiovascular: Normal rate, regular rhythm, normal heart sounds and intact distal pulses.        No peripheral  edema or erythema  Pulmonary/Chest: Effort normal and breath sounds normal. No respiratory distress. She has no wheezes. She has no rales. She exhibits no tenderness.  Abdominal: Soft. There is no tenderness.  Musculoskeletal: Normal range of motion.  Lymphadenopathy:    She has cervical adenopathy.  Neurological: She is alert.  Skin: Skin is warm and dry. No rash noted. She is not diaphoretic.  Psychiatric: She has a normal mood and affect.    ED Course  Procedures (including critical care time)   Labs Reviewed  RAPID STREP SCREEN   Dg Chest 2 View  10/13/2011  *RADIOLOGY REPORT*  Clinical Data: Cough and sore throat; possible fever.  CHEST - 2 VIEW  Comparison: Chest radiograph performed 04/02/2006  Findings: The lungs are well-aerated.  Mild vascular congestion is noted; increased interstitial markings could conceivably reflect minimal interstitial edema.  There is no evidence of pleural effusion or pneumothorax.  The heart is borderline enlarged.  No acute osseous abnormalities are seen.  Clips are noted within the right upper quadrant, reflecting prior cholecystectomy.  IMPRESSION: Mild vascular congestion and borderline cardiomegaly; increased interstitial markings could conceivably reflect minimal interstitial edema.  Original Report Authenticated By: Tonia Ghent, M.D.     1. Viral illness       MDM  Patient presents today with cough, rhinorrhea, congestion, and sore throat.  She has also had a subjective fever.   Rapid strep negative.  No pneumonia on CXR.  Discussed the above CXR results with Dr. Effie Shy.  Patient not in any acute respiratory distress.  Pulse ox 97 on RA.  No peripheral edema.  Also discussed results of the CXR with patient.  Patient instructed to follow up with PCP.        Pascal Lux [place], PA-C 10/14/11 1626

## 2011-10-13 NOTE — ED Notes (Signed)
D/c instructions reviewed w/ pt and family - pt and family deny any further questions or concerns at present.\ 

## 2011-10-13 NOTE — ED Notes (Signed)
Patient transported to X-ray 

## 2011-10-13 NOTE — ED Notes (Signed)
Pt reports sore throat that began on Tuesday - tonsils difficult to visualize d/t pt having difficulty cooperating during exam. Pt in no acute distress, resting comfortably on stretcher.

## 2011-10-13 NOTE — Discharge Instructions (Signed)
Read the instructions below on reasons to return to the emergency department and to learn more about your diagnosis.  Use over the counter medications for symptomatic relief as we discussed (musinex as a decongestant, Tylenol for fever/pain, Motrin/Ibuprofen for muscle aches).  Followup with your primary care doctor in the next 2-3 days.  Your more than welcome to return to the emergency department if symptoms worsen or become concerning.  Upper Respiratory Infection, Adult  An upper respiratory infection (URI) is also sometimes known as the common cold. Most people improve within 1 week, but symptoms can last up to 2 weeks. A residual cough may last even longer.   URI is most commonly caused by a virus. Viruses are NOT treated with antibiotics. You can easily spread the virus to others by oral contact. This includes kissing, sharing a glass, coughing, or sneezing. Touching your mouth or nose and then touching a surface, which is then touched by another person, can also spread the virus.   TREATMENT  Treatment is directed at relieving symptoms. There is no cure. Antibiotics are not effective, because the infection is caused by a virus, not by bacteria. Treatment may include:  Increased fluid intake. Sports drinks offer valuable electrolytes, sugars, and fluids.  Breathing heated mist or steam (vaporizer or shower).  Eating chicken soup or other clear broths, and maintaining good nutrition.  Getting plenty of rest.  Using gargles or lozenges for comfort.  Controlling fevers with ibuprofen or acetaminophen as directed by your caregiver.  Increasing usage of your inhaler if you have asthma.  Return to work when your temperature has returned to normal.   SEEK MEDICAL CARE IF:  After the first few days, you feel you are getting worse rather than better.  You develop worsening shortness of breath, or brown or red sputum. These may be signs of pneumonia.  You develop yellow or brown nasal discharge or  pain in the face, especially when you bend forward. These may be signs of sinusitis.  You develop a fever, swollen neck glands, pain with swallowing, or white areas in the back of your throat. These may be signs of strep throat.

## 2011-10-18 NOTE — ED Provider Notes (Signed)
Medical screening examination/treatment/procedure(s) were performed by non-physician practitioner and as supervising physician I was immediately available for consultation/collaboration.  Flint Melter, MD 10/18/11 [phone]

## 2013-01-25 ENCOUNTER — Encounter (HOSPITAL_COMMUNITY): Payer: Self-pay | Admitting: *Deleted

## 2013-01-25 ENCOUNTER — Inpatient Hospital Stay (HOSPITAL_COMMUNITY)
Admission: AD | Admit: 2013-01-25 | Discharge: 2013-01-25 | Disposition: A | Discharge status: Home / Self Care | Payer: Self-pay | Source: Ambulatory Visit | Attending: Obstetrics & Gynecology | Admitting: Obstetrics & Gynecology

## 2013-01-25 DIAGNOSIS — N921 Excessive and frequent menstruation with irregular cycle: Secondary | ICD-10-CM

## 2013-01-25 DIAGNOSIS — N920 Excessive and frequent menstruation with regular cycle: Secondary | ICD-10-CM | POA: Insufficient documentation

## 2013-01-25 DIAGNOSIS — N390 Urinary tract infection, site not specified: Secondary | ICD-10-CM | POA: Insufficient documentation

## 2013-01-25 DIAGNOSIS — R109 Unspecified abdominal pain: Secondary | ICD-10-CM | POA: Insufficient documentation

## 2013-01-25 LAB — URINALYSIS, ROUTINE W REFLEX MICROSCOPIC
Glucose, UA: NEGATIVE mg/dL
Protein, ur: 100 mg/dL — AB
Specific Gravity, Urine: >1.03 — ABNORMAL HIGH (ref 1.005–1.030)
Urobilinogen, UA: 1 mg/dL (ref 0.0–1.0)

## 2013-01-25 LAB — CBC
MCH: 29.5 pg (ref 26.0–34.0)
Platelets: 349 10*3/uL (ref 150–400)
RBC: 4.17 MIL/uL (ref 3.87–5.11)

## 2013-01-25 LAB — URINE MICROSCOPIC-ADD ON

## 2013-01-25 MED ORDER — CEPHALEXIN 500 MG PO CAPS: 500.0000 mg | ORAL_CAPSULE | Freq: Two times a day (BID) | ORAL | Status: AC

## 2013-01-25 MED ORDER — CIPROFLOXACIN HCL 500 MG PO TABS: 500.0000 mg | ORAL_TABLET | Freq: Two times a day (BID) | ORAL | Status: DC

## 2013-01-25 MED ORDER — MEDROXYPROGESTERONE ACETATE 10 MG PO TABS: 10.0000 mg | ORAL_TABLET | Freq: Every day | ORAL | Status: DC

## 2013-01-25 MED ORDER — CEPHALEXIN 500 MG PO CAPS
500.0000 mg | ORAL_CAPSULE | ORAL | Status: AC
  Administered 2013-01-25: 500 mg via ORAL
  Filled 2013-01-25: qty 1

## 2013-01-25 NOTE — MAU Provider Note (Signed)
Chief Complaint: Vaginal Bleeding and Abdominal Pain   First Provider Initiated Contact with Patient 01/25/13 1830     SUBJECTIVE HPI: [name] is a 27 y.o. G3P3003 who presents to maternity admissions reporting irregular vaginal bleeding with Paraguard.  She has had irregular heavy menses since her IUD was placed 1.5 years ago but in the last 3 months periods have been heavier and more frequent.  She is breastfeeding her 26 month old son currently.  [facility] [place] and had an annual exam in February of this year. She denies risks for STDs.  She denies vaginal itching/burning, urinary symptoms, h/a, dizziness, n/v, or fever/chills.     Past Medical History  Diagnosis Date  . Medical history non-contributory    Past Surgical History  Procedure Laterality Date  . Gallbladder surgery  2007  . Cholecystectomy    . Cesarean section     History   Social History  . Marital Status: Married    Spouse Name: N/A    Number of Children: N/A  . Years of Education: N/A   Occupational History  . Not on file.   Social History Main Topics  . Smoking status: Never Smoker   . Smokeless tobacco: Never Used  . Alcohol Use: No  . Drug Use: No  . Sexually Active: Yes    Birth Control/ Protection: IUD   Other Topics Concern  . Not on file   Social History Narrative  . No narrative on file   No current facility-administered medications on file prior to encounter.   Current Outpatient Prescriptions on File Prior to Encounter  Medication Sig Dispense Refill  . acetaminophen (TYLENOL) 325 MG tablet Take 650 mg by mouth every 6 (six) hours as needed. headache       No Known Allergies  ROS: Pertinent items in HPI  OBJECTIVE Blood pressure 118/69, pulse 84, temperature 98.4 F (36.9 C), temperature source Oral, resp. rate 20, height 5' 6.75" (1.695 m), weight 123.56 kg (272 lb 6.4 oz), last menstrual period 12/24/2012, SpO2 100.00%,  currently breastfeeding. GENERAL: Well-developed, well-nourished female in no acute distress.  HEENT: Normocephalic HEART: normal rate RESP: normal effort ABDOMEN: Soft, non-tender EXTREMITIES: Nontender, no edema NEURO: Alert and oriented SPECULUM EXAM: Deferred  LAB RESULTS Results for orders placed during the hospital encounter of 01/25/13 (from the past 24 hour(s))  URINALYSIS, ROUTINE W REFLEX MICROSCOPIC     Status: Abnormal   Collection Time    01/25/13  5:50 PM      Result Value Range   Color, Urine RED (*) YELLOW   APPearance CLOUDY (*) CLEAR   Specific Gravity, Urine >1.030 (*) 1.005 - 1.030   pH 5.5  5.0 - 8.0   Glucose, UA NEGATIVE  NEGATIVE mg/dL   Hgb urine dipstick LARGE (*) NEGATIVE   Bilirubin Urine SMALL (*) NEGATIVE   Ketones, ur 15 (*) NEGATIVE mg/dL   Protein, ur 098 (*) NEGATIVE mg/dL   Urobilinogen, UA 1.0  0.0 - 1.0 mg/dL   Nitrite POSITIVE (*) NEGATIVE   Leukocytes, UA SMALL (*) NEGATIVE  URINE MICROSCOPIC-ADD ON     Status: Abnormal   Collection Time    01/25/13  5:50 PM      Result Value Range   Squamous Epithelial / LPF FEW (*) RARE   RBC / HPF TOO NUMEROUS TO COUNT  <3 RBC/hpf    ASSESSMENT 1. UTI (lower urinary tract infection)   2. Menometrorrhagia  PLAN Keflex 500 mg PO x1 dose in MAU Discharge home Keflex 500 mg BID x7 days Provera 10 mg x10 days Continue Ibuprofen 600 mg Q6 hours when bleeding F/U with Health Dept Return to MAU as needed    Medication List    ASK your doctor about these medications       acetaminophen 325 MG tablet  Commonly known as:  TYLENOL  Take 650 mg by mouth every 6 (six) hours as needed. headache     ibuprofen 200 MG tablet  Commonly known as:  ADVIL,MOTRIN  Take 200 mg by mouth every 6 (six) hours as needed for pain.     prenatal vitamin w/FE, FA 27-1 MG Tabs  Take 1 tablet by mouth daily.         Sharen Counter Certified Nurse-Midwife 01/25/2013  6:46 PM

## 2013-01-25 NOTE — MAU Note (Signed)
Patient states she has had a periguard IUD for about 1 1/2 years. States she has had irregular periods with the IUD. Last period was 6-20/29 then about 3 days later started bleeding again. Has had bleeding on and off but became heavy this am with some abdominal cramping.

## 2013-01-26 LAB — URINE CULTURE: Colony Count: NO GROWTH

## 2013-10-06 ENCOUNTER — Encounter (HOSPITAL_COMMUNITY): Payer: Self-pay | Admitting: Emergency Medicine

## 2013-10-06 ENCOUNTER — Emergency Department (HOSPITAL_COMMUNITY)
Admission: EM | Admit: 2013-10-06 | Discharge: 2013-10-06 | Disposition: A | Discharge status: Home / Self Care | Payer: 59 | Attending: Emergency Medicine | Admitting: Emergency Medicine

## 2013-10-06 DIAGNOSIS — Z9889 Other specified postprocedural states: Secondary | ICD-10-CM | POA: Insufficient documentation

## 2013-10-06 DIAGNOSIS — R5381 Other malaise: Secondary | ICD-10-CM | POA: Insufficient documentation

## 2013-10-06 DIAGNOSIS — K5289 Other specified noninfective gastroenteritis and colitis: Secondary | ICD-10-CM | POA: Insufficient documentation

## 2013-10-06 DIAGNOSIS — K529 Noninfective gastroenteritis and colitis, unspecified: Secondary | ICD-10-CM

## 2013-10-06 DIAGNOSIS — Z9089 Acquired absence of other organs: Secondary | ICD-10-CM | POA: Insufficient documentation

## 2013-10-06 DIAGNOSIS — Z3202 Encounter for pregnancy test, result negative: Secondary | ICD-10-CM | POA: Insufficient documentation

## 2013-10-06 DIAGNOSIS — R109 Unspecified abdominal pain: Secondary | ICD-10-CM

## 2013-10-06 DIAGNOSIS — R5383 Other fatigue: Secondary | ICD-10-CM

## 2013-10-06 LAB — CBC
HCT: 34.5 % — ABNORMAL LOW (ref 36.0–46.0)
Hemoglobin: 12 g/dL (ref 12.0–15.0)
MCH: 30.1 pg (ref 26.0–34.0)
MCHC: 34.8 g/dL (ref 30.0–36.0)
MCV: 86.5 fL (ref 78.0–100.0)
Platelets: 332 K/uL (ref 150–400)
RBC: 3.99 MIL/uL (ref 3.87–5.11)
RDW: 13.1 % (ref 11.5–15.5)
WBC: 5.9 K/uL (ref 4.0–10.5)

## 2013-10-06 LAB — COMPREHENSIVE METABOLIC PANEL WITH GFR
ALT: 17 U/L (ref 0–35)
AST: 21 U/L (ref 0–37)
Albumin: 3.5 g/dL (ref 3.5–5.2)
Alkaline Phosphatase: 70 U/L (ref 39–117)
BUN: 9 mg/dL (ref 6–23)
CO2: 21 meq/L (ref 19–32)
Calcium: 9.2 mg/dL (ref 8.4–10.5)
Chloride: 106 meq/L (ref 96–112)
Creatinine, Ser: 0.52 mg/dL (ref 0.50–1.10)
GFR calc Af Amer: >90 mL/min
GFR calc non Af Amer: >90 mL/min
Glucose, Bld: 87 mg/dL (ref 70–99)
Potassium: 4.2 meq/L (ref 3.7–5.3)
Sodium: 140 meq/L (ref 137–147)
Total Bilirubin: 0.4 mg/dL (ref 0.3–1.2)
Total Protein: 7.1 g/dL (ref 6.0–8.3)

## 2013-10-06 LAB — URINALYSIS, ROUTINE W REFLEX MICROSCOPIC
Bilirubin Urine: NEGATIVE
Glucose, UA: NEGATIVE mg/dL
Hgb urine dipstick: NEGATIVE
Ketones, ur: NEGATIVE mg/dL
Leukocytes, UA: NEGATIVE
Nitrite: NEGATIVE
Protein, ur: NEGATIVE mg/dL
Specific Gravity, Urine: 1.019 (ref 1.005–1.030)
Urobilinogen, UA: 0.2 mg/dL (ref 0.0–1.0)
pH: 5.5 (ref 5.0–8.0)

## 2013-10-06 LAB — POC URINE PREG, ED: Preg Test, Ur: NEGATIVE

## 2013-10-06 MED ORDER — DIPHENOXYLATE-ATROPINE 2.5-0.025 MG PO TABS: 1.0000 | ORAL_TABLET | Freq: Four times a day (QID) | ORAL | Status: DC | PRN

## 2013-10-06 MED ORDER — SODIUM CHLORIDE 0.9 % IV BOLUS (SEPSIS)
1000.0000 mL | Freq: Once | INTRAVENOUS | Status: AC
  Administered 2013-10-06: 1000 mL via INTRAVENOUS

## 2013-10-06 MED ORDER — ONDANSETRON 4 MG PO TBDP: 4.0000 mg | ORAL_TABLET | Freq: Three times a day (TID) | ORAL | Status: DC | PRN

## 2013-10-06 MED ORDER — TRAMADOL HCL 50 MG PO TABS: 50.0000 mg | ORAL_TABLET | Freq: Four times a day (QID) | ORAL | Status: DC | PRN

## 2013-10-06 MED ORDER — MORPHINE SULFATE 4 MG/ML IJ SOLN
2.0000 mg | Freq: Once | INTRAMUSCULAR | Status: AC
  Administered 2013-10-06: 2 mg via INTRAVENOUS
  Filled 2013-10-06: qty 1

## 2013-10-06 NOTE — ED Notes (Signed)
Two unsuccessful IV attempts.  IV team called.

## 2013-10-06 NOTE — ED Provider Notes (Signed)
CSN: [id number]     Arrival date & time 10/06/13  0436 History   First MD Initiated Contact with Patient 10/06/13 [phone]     Chief Complaint  Patient presents with  . Abdominal Pain     (Consider location/radiation/quality/duration/timing/severity/associated sxs/prior Treatment) HPI Comments: 28 year old female with a history of a cholecystectomy and a cesarean section who presents with a complaint of abdominal pain. This has been ongoing for the last 3 days, it started with watery diarrhea and nausea and vomiting and has been persistent for the last several days. The nausea and vomiting has resolved, the patient still has multiple episodes of watery diarrhea every day and now has developed mild generalized abdominal tenderness. This is nonfocal, nothing seems to make it better or worse, she has no fevers, chills, cough or shortness of breath. She states that her diarrhea is watery, nonbloody and that she has a child with similar symptoms however the child's symptoms resolved spontaneously. The patient also notes that the palms of her hands have a slight yellow hue to them.  She has not recently been on antibiotics and has not traveled out of the country in over 12 years. She has been drinking plenty of fluids over the last 24 hours, denies dark colored urine or dysuria. She does not drink alcohol, she takes occasional Tylenol and had some last night.  Patient is a 28 y.o. female presenting with abdominal pain. The history is provided by the patient.  Abdominal Pain   Past Medical History  Diagnosis Date  . Medical history non-contributory    Past Surgical History  Procedure Laterality Date  . Gallbladder surgery  2007  . Cholecystectomy    . Cesarean section     No family history on file. History  Substance Use Topics  . Smoking status: Never Smoker   . Smokeless tobacco: Never Used  . Alcohol Use: No   OB History   Grav Para Term Preterm Abortions TAB SAB Ect Mult Living   3 3 3  0 0  0 0 0 0 3     Review of Systems  Gastrointestinal: Positive for abdominal pain.  All other systems reviewed and are negative.      Allergies  Review of patient's allergies indicates no known allergies.  Home Medications   Current Outpatient Rx  Name  Route  Sig  Dispense  Refill  . acetaminophen (TYLENOL) 325 MG tablet   Oral   Take 650 mg by mouth every 6 (six) hours as needed. headache         . ibuprofen (ADVIL,MOTRIN) 200 MG tablet   Oral   Take 200 mg by mouth every 6 (six) hours as needed for pain.         . diphenoxylate-atropine (LOMOTIL) 2.5-0.025 MG per tablet   Oral   Take 1 tablet by mouth 4 (four) times daily as needed for diarrhea or loose stools.   30 tablet   0   . ondansetron (ZOFRAN ODT) 4 MG disintegrating tablet   Oral   Take 1 tablet (4 mg total) by mouth every 8 (eight) hours as needed for nausea.   10 tablet   0   . traMADol (ULTRAM) 50 MG tablet   Oral   Take 1 tablet (50 mg total) by mouth every 6 (six) hours as needed.   15 tablet   0    BP 108/68  Pulse 68  Temp(Src) 98.8 F (37.1 C) (Oral)  Resp 18  SpO2 100%  LMP 09/16/2013 Physical Exam  Nursing note and vitals reviewed. Constitutional: She appears well-developed and well-nourished. No distress.  HENT:  Head: Normocephalic and atraumatic.  Mouth/Throat: Oropharynx is clear and moist. No oropharyngeal exudate.  Eyes: Conjunctivae and EOM are normal. Pupils are equal, round, and reactive to light. Right eye exhibits no discharge. Left eye exhibits no discharge. No scleral icterus.  Neck: Normal range of motion. Neck supple. No JVD present. No thyromegaly present.  Cardiovascular: Normal rate, regular rhythm, normal heart sounds and intact distal pulses.  Exam reveals no gallop and no friction rub.   No murmur heard. Pulmonary/Chest: Effort normal and breath sounds normal. No respiratory distress. She has no wheezes. She has no rales.  Abdominal: Soft. Bowel sounds are  normal. She exhibits no distension and no mass. There is tenderness ( Mild diffuse tenderness, no guarding, very soft).  Normal bowel sounds in all 4 quadrants, no tympanitic sounds to percussion, no peritoneal signs  Musculoskeletal: Normal range of motion. She exhibits no edema and no tenderness.  Lymphadenopathy:    She has no cervical adenopathy.  Neurological: She is alert. Coordination normal.  Skin: Skin is warm and dry. No rash noted. No erythema.  Psychiatric: She has a normal mood and affect. Her behavior is normal.    ED Course  Procedures (including critical care time) Labs Review Labs Reviewed  CBC - Abnormal; Notable for the following:    HCT 34.5 (*)    All other components within normal limits  URINALYSIS, ROUTINE W REFLEX MICROSCOPIC - Abnormal; Notable for the following:    APPearance HAZY (*)    All other components within normal limits  COMPREHENSIVE METABOLIC PANEL  POC URINE PREG, ED   Imaging Review No results found.   MDM   Final diagnoses:  Abdominal pain  Gastroenteritis    , She has no conjunctival icterus, no significant jaundice though her palms do appear slightly yellow. Vital signs are normal, tolerating oral fluids without difficulty, she does feel generally weak and this will be evaluated with lab work including a CBC for anemia, and potassium for possible hypokalemia related to her disease.  It does appear infectious in etiology given the child similar symptoms this week.  Labs unremarkable,  Pt improved, stable for d/c.  Meds given in ED:  Medications  morphine 4 MG/ML injection 2 mg (2 mg Intravenous Given 10/06/13 0548)  sodium chloride 0.9 % bolus 1,000 mL (0 mLs Intravenous Stopped 10/06/13 0709)    Discharge Medication List as of 10/06/2013  6:52 AM    START taking these medications   Details  diphenoxylate-atropine (LOMOTIL) 2.5-0.025 MG per tablet Take 1 tablet by mouth 4 (four) times daily as needed for diarrhea or loose stools.,  Starting 10/06/2013, Until Discontinued, Print    ondansetron (ZOFRAN ODT) 4 MG disintegrating tablet Take 1 tablet (4 mg total) by mouth every 8 (eight) hours as needed for nausea., Starting 10/06/2013, Until Discontinued, Print    traMADol (ULTRAM) 50 MG tablet Take 1 tablet (50 mg total) by mouth every 6 (six) hours as needed., Starting 10/06/2013, Until Discontinued, Print          Vida RollerBrian D Kimberlynn Lumbra, MD 10/07/13 [phone]

## 2013-10-06 NOTE — ED Notes (Signed)
The tp is c/o generalized abd pain with nv and diarrhea since Monday.  lmp march 14th

## 2013-10-06 NOTE — ED Notes (Signed)
Epigastric pain along with diarrhea x 2 days.  Vomiting has now stopped, diarrhea continues.

## 2013-10-06 NOTE — Discharge Instructions (Signed)
Your testing has been normal - please drink plenty of fluids - you may use the tramadol for pain that is not relieved with tylenol or ibuprofen.  Zofran for nausea and lomotil for diarrhea if it continues - this should gradually improve over the next several days.    Please call your doctor for a followup appointment within 24-48 hours. When you talk to your doctor please let them know that you were seen in the emergency department and have them acquire all of your records so that they can discuss the findings with you and formulate a treatment plan to fully care for your new and ongoing problems.   Emergency Department Resource Guide 1) Find a Doctor and Pay Out of Pocket Although you won't have to find out who is covered by your insurance plan, it is a good idea to ask around and get recommendations. You will then need to call the office and see if the doctor you have chosen will accept you as a new patient and what types of options they offer for patients who are self-pay. Some doctors offer discounts or will set up payment plans for their patients who do not have insurance, but you will need to ask so you aren't surprised when you get to your appointment.  2) Contact Your Local Health Department Not all health departments have doctors that can see patients for sick visits, but many do, so it is worth a call to see if yours does. If you don't know where your local health department is, you can check in your phone book. The CDC also has a tool to help you locate your state's health department, and many state websites also have listings of all of their local health departments.  3) Find a Walk-in Clinic If your illness is not likely to be very severe or complicated, you may want to try a walk in clinic. These are popping up all over the country in pharmacies, drugstores, and shopping centers. They're usually staffed by nurse practitioners or physician assistants that have been trained to treat common  illnesses and complaints. They're usually fairly quick and inexpensive. However, if you have serious medical issues or chronic medical problems, these are probably not your best option.  No Primary Care Doctor: - Call Health Connect at  [phone] - they can help you locate a primary care doctor that  accepts your insurance, provides certain services, etc. - Physician Referral Service- [phone]  Chronic Pain Problems: Organization         Address  Phone   Notes  [facility]  [phone] Patients need to be referred by their primary care doctor.   Medication Assistance: Organization         Address  Phone   Notes  [facility] [address] [place], [place] [address] [phone]) [phone] --Must be a resident of [place] -- Must have NO insurance coverage whatsoever (no Medicaid/ Medicare, etc.) -- The pt. MUST have a primary care doctor that directs their care regularly and follows them in the community   MedAssist  [phone]   [employer]  [phone]    Agencies that provide inexpensive medical care: Organization         Address  Phone   Notes  [facility]  [phone]   [facility]    [phone]   [facility] [address] [place], [place]  [address] [phone]   [facility] [address] [place] [phone]   Planned Parenthood    [phone]   [facility]    [phone]   [facility]  [address], [place] Phone:  [phone], Fax:  [phone] Hours of Operation:  9 am - 6 pm, M-F.  Also accepts Medicaid/Medicare and self-pay.  [facility]  [address], [place] Phone: [phone], Fax: [phone]. Hours of Operation:  8:30 am - 5:30 pm, M-F.  Also accepts Medicaid and self-pay.  [facility] [place]  [address], [place] Phone: [phone]   [facility] [address] Natasha Bence [place], [place] [phone], Ext. [phone] Mondays & Thursdays: 7-9 AM.  First 15 patients are seen on a first come, first serve basis.    Medicaid-accepting [facility] Providers:  Organization         Address  Phone   Notes  [facility] [address], [place] [phone] Also accepts self-pay patients.  [facility] [address] Laurell Josephs [place], [place]  [phone]   [facility] [address], [place] [phone]   [facility] [address], [place] [phone]   Renaye Rakers [address], [place]   [phone] Only accepts [place] patients after they have their name applied to their card.   Self-Pay (no insurance) in [facility]:  Organization         Address  Phone   Notes  Sickle Cell Patients, [facility] [address] [place], [place] [phone]   [facility] [address] [place], [place] [phone]   [facility] [place]  [address], [place] [phone]   [facility]/Dr. Osei-Bonsu  [address], [place] or [address], [place] [phone] Phone number for both [place] and [place] locations is the same.  [facility]gent [facility] and [facility] [address], [place] [phone]   [facility] [address] [place] or [address] [phone]   [facility] [address], [place] [phone], phone; [phone], fax Sees patients 1st and 3rd Saturday of every month.  Must not qualify for public or private insurance (i.e. Medicaid, Medicare, [place] Health Choice, Veterans' Benefits)  Household income should be no more than 200% of the  poverty level The clinic cannot treat you if you are pregnant or think you are pregnant  Sexually transmitted diseases are not treated at the clinic.    Dental Care: Organization         Address  Phone  Notes  [facility] of [facility] [address] [place], [place] [phone] Accepts children up to age 77 who are enrolled in [place] or [place] Health Choice; pregnant women with a Medicaid card; and children who have applied for Medicaid or [place] Health Choice, but were declined, whose parents can pay a reduced fee at time of service.  [facility] of [facility]  [address], [place] [phone] Accepts children up to age 42 who are enrolled in [place] or [place] Health Choice; pregnant women  with a Medicaid card; and children who have applied for Medicaid or [place] Health Choice, but were declined, whose parents can pay a reduced fee at time of service.  [facility]  [address], [place] [phone] Patients are seen by appointment only. Walk-ins are not accepted. [facility] will see patients 27 years of age and older. Monday - Tuesday (8am-5pm) Most Wednesdays (8:30-5pm) $30 per visit, cash only  [facility]  [address], [facility] [place] [phone] Patients are seen by appointment only. Walk-ins are not accepted. [facility] will see patients 518 years of age and older. One Wednesday Evening (Monthly: Volunteer Based).  $30 per visit, cash only  [employer] [facility]  [phone] for adults; Children under age 564, call Graduate Pediatric Dentistry at [phone]. Children aged 584-14, please call [phone] to request a pediatric application.  Dental services are provided in all areas of dental care including fillings, crowns and bridges, complete and partial dentures, implants, gum treatment, root canals, and extractions.  Preventive care is also provided. Treatment is provided to both adults and children. Patients are selected via a lottery and there is often a waiting list.   [facility] [address], [place]  [phone]) [phone] [email or web address]   [facility] [address], [place], [place] [phone], Ext. [phone] Second and Fourth Thursday of each month, opens at 6:30 AM; Clinic ends at 9 AM.  Patients are seen on a first-come first-served basis, and a limited number are seen during each clinic.   [facility]  [address], [place], [place] [phone]   Eligibility Requirements You must have lived in [place], [place], or [place] counties for at least the last three months.   You cannot be eligible for state or federal sponsored National Cityhealthcare insurance, including CIGNAVeterans Administration, [place]icaid, or [employer]Medicare.   You generally cannot be eligible for healthcare insurance through your employer.    How to apply: Eligibility screenings are held every Tuesday and Wednesday afternoon from 1:00 pm until 4:00 pm. You do not need an appointment for the interview!  [facility] [address], [place], [place] [phone]   [facility]  [phone]   [facility]  [phone]   [facility]  [phone]    Behavioral Health Resources in the Community: Intensive Outpatient Programs Organization         Address  Phone  Notes  [facility] [address], [place], [place] [phone]   [facility] [address], [place], [place] [phone]   [facility] [address], [place], [place]  [phone]   [facility] [address],  [place], [place] [phone] or [phone]   Substance Abuse Resources Organization         Address  Phone  Notes  [facility]  [phone]   Addiction  [facility]  [phone]   [facility]  [phone]   [facility]  [phone]   Residential & Outpatient Substance Abuse Program  [phone]   Psychological Services Organization         Address  Phone  Notes  [facility]  [phone]   [facility]  [phone]- [phone]   [facility] [address], [place] [phone] or [phone]    Mobile Crisis Teams  Organization         Address  Phone  Notes  [facility], Mobile Crisis Care Unit  [phone]   [facility]  [address] Luis Llorons [place], [place] [phone]   [facility] De[facility]sch [address] [place] [phone]    Self-Help/Support Groups Organization         Address  Phone             Notes  [facility] [place] - variety of support groups  336- [phone] Call for more information  Narcotics Anonymous (NA), [facility] [address], [place]  2 meetings at this location   Statisticianesidential Treatment Programs Organization         Address  Phone  Notes  [facility] [address],    [place]  [phone]   [facility]  [address], [place] [phone], [place], [place] [phone]   [facility] [address], [place] [phone] Admissions: 8am-3pm M-F  [facility] [address]    [place], [place] [phone]   [facility] [address], [place], [place] [phone]   [facility] [address]  [place], [place] [phone]   [facility] - Intensive Outpatient [address], [place], [place] [phone]   [facility] (Addiction [facility] [address]  [place], [place] [phone] or [phone]   [facility] ([facility]) [address] [place], [place] [phone] Accepts Medicaid  [facility] [address]  [place]  [phone] Substance Abuse/Addiction Treatment   [facility] Behavioral Health Resources Organization         Address  Phone  Notes  [facility]  [phone]   Angie FavaJulie Brannon, PhD [address], [address] [place], [place]   [phone]) [phone] or [phone]   [facility]   [address] [place], [place] [phone]   [facility] [address], [place], [place] [phone] Insurance/Medicaid/sponsorship through [facility]  [facility] [address]                                    [place], [place] [phone] Therapy/tele-psych/case  [facility] [address].   [place], [place] [phone]    Dr. Lolly MustacheArfeen  [phone](336) [phone]   [facility]  [employer] [facility] 1) [address], [place] 2) [address], [place] 3)  [address], [place] [phone](336) [phone]  [phone]   [facility] Child Abuse Hotline [phone](336) [phone] or [phone] (After Hours)

## 2014-03-21 ENCOUNTER — Other Ambulatory Visit: Payer: Self-pay | Admitting: Infectious Disease

## 2014-03-21 ENCOUNTER — Ambulatory Visit
Admission: RE | Admit: 2014-03-21 | Discharge: 2014-03-21 | Disposition: A | Discharge status: Home / Self Care | Payer: No Typology Code available for payment source | Source: Ambulatory Visit | Attending: Infectious Disease | Admitting: Infectious Disease

## 2014-03-21 DIAGNOSIS — R7611 Nonspecific reaction to tuberculin skin test without active tuberculosis: Secondary | ICD-10-CM

## 2014-05-08 ENCOUNTER — Encounter (HOSPITAL_COMMUNITY): Payer: Self-pay | Admitting: Emergency Medicine

## 2015-04-09 ENCOUNTER — Emergency Department (INDEPENDENT_AMBULATORY_CARE_PROVIDER_SITE_OTHER)
Admission: EM | Admit: 2015-04-09 | Discharge: 2015-04-09 | Disposition: A | Discharge status: Home / Self Care | Payer: 59 | Source: Home / Self Care | Attending: Family Medicine | Admitting: Family Medicine

## 2015-04-09 ENCOUNTER — Encounter (HOSPITAL_COMMUNITY): Payer: Self-pay | Admitting: Emergency Medicine

## 2015-04-09 DIAGNOSIS — M545 Low back pain, unspecified: Secondary | ICD-10-CM

## 2015-04-09 DIAGNOSIS — M549 Dorsalgia, unspecified: Secondary | ICD-10-CM

## 2015-04-09 DIAGNOSIS — G8929 Other chronic pain: Secondary | ICD-10-CM | POA: Diagnosis not present

## 2015-04-09 HISTORY — DX: Dorsalgia, unspecified: M54.9

## 2015-04-09 MED ORDER — TRAMADOL HCL 50 MG PO TABS: 50.0000 mg | ORAL_TABLET | Freq: Four times a day (QID) | ORAL | Status: DC | PRN

## 2015-04-09 NOTE — ED Notes (Signed)
Vitals taken at 13:29

## 2015-04-09 NOTE — Discharge Instructions (Signed)
Back Pain, Adult °Low back pain is very common. About 1 in 5 people have back pain. The cause of low back pain is rarely dangerous. The pain often gets better over time. About half of people with a sudden onset of back pain feel better in just 2 weeks. About 8 in 10 people feel better by 6 weeks.  °CAUSES °Some common causes of back pain include: °· Strain of the muscles or ligaments supporting the spine. °· Wear and tear (degeneration) of the spinal discs. °· Arthritis. °· Direct injury to the back. °DIAGNOSIS °Most of the time, the direct cause of low back pain is not known. However, back pain can be treated effectively even when the exact cause of the pain is unknown. Answering your caregiver's questions about your overall health and symptoms is one of the most accurate ways to make sure the cause of your pain is not dangerous. If your caregiver needs more information, he or she may order lab work or imaging tests (X-rays or MRIs). However, even if imaging tests show changes in your back, this usually does not require surgery. °HOME CARE INSTRUCTIONS °For many people, back pain returns. Since low back pain is rarely dangerous, it is often a condition that people can learn to manage on their own.  °· Remain active. It is stressful on the back to sit or stand in one place. Do not sit, drive, or stand in one place for more than 30 minutes at a time. Take short walks on level surfaces as soon as pain allows. Try to increase the length of time you walk each day. °· Do not stay in bed. Resting more than 1 or 2 days can delay your recovery. °· Do not avoid exercise or work. Your body is made to move. It is not dangerous to be active, even though your back may hurt. Your back will likely heal faster if you return to being active before your pain is gone. °· Pay attention to your body when you  bend and lift. Many people have less discomfort when lifting if they bend their knees, keep the load close to their bodies, and  avoid twisting. Often, the most comfortable positions are those that put less stress on your recovering back. °· Find a comfortable position to sleep. Use a firm mattress and lie on your side with your knees slightly bent. If you lie on your back, put a pillow under your knees. °· Only take over-the-counter or prescription medicines as directed by your caregiver. Over-the-counter medicines to reduce pain and inflammation are often the most helpful. Your caregiver may prescribe muscle relaxant drugs. These medicines help dull your pain so you can more quickly return to your normal activities and healthy exercise. °· Put ice on the injured area. °¨ Put ice in a plastic bag. °¨ Place a towel between your skin and the bag. °¨ Leave the ice on for 15-20 minutes, 03-04 times a day for the first 2 to 3 days. After that, ice and heat may be alternated to reduce pain and spasms. °· Ask your caregiver about trying back exercises and gentle massage. This may be of some benefit. °· Avoid feeling anxious or stressed. Stress increases muscle tension and can worsen back pain. It is important to recognize when you are anxious or stressed and learn ways to manage it. Exercise is a great option. °SEEK MEDICAL CARE IF: °· You have pain that is not relieved with rest or medicine. °· You have pain that does not improve in 1 week. °· You have new symptoms. °· You are generally not feeling well. °SEEK   IMMEDIATE MEDICAL CARE IF:  °· You have pain that radiates from your back into your legs. °· You develop new bowel or bladder control problems. °· You have unusual weakness or numbness in your arms or legs. °· You develop nausea or vomiting. °· You develop abdominal pain. °· You feel faint. °Document Released: 06/23/2005 Document Revised: 12/23/2011 Document Reviewed: 10/25/2013 °ExitCare® Patient Information ©2015 [employer]. This information is not intended to replace advice given to you by your health care provider. Make sure you  discuss any questions you have with your health care provider. ° °Chronic Back Pain ° When back pain lasts longer than 3 months, it is called chronic back pain. People with chronic back pain often go through certain periods that are more intense (flare-ups).  °CAUSES °Chronic back pain can be caused by wear and tear (degeneration) on different structures in your back. These structures include: °· The bones of your spine (vertebrae) and the joints surrounding your spinal cord and nerve roots (facets). °· The strong, fibrous tissues that connect your vertebrae (ligaments). °Degeneration of these structures may result in pressure on your nerves. This can lead to constant pain. °HOME CARE INSTRUCTIONS °· Avoid bending, heavy lifting, prolonged sitting, and activities which make the problem worse. °· Take brief periods of rest throughout the day to reduce your pain. Lying down or standing usually is better than sitting while you are resting. °· Take over-the-counter or prescription medicines only as directed by your caregiver. °SEEK IMMEDIATE MEDICAL CARE IF:  °· You have weakness or numbness in one of your legs or feet. °· You have trouble controlling your bladder or bowels. °· You have nausea, vomiting, abdominal pain, shortness of breath, or fainting. °Document Released: 07/31/2004 Document Revised: 09/15/2011 Document Reviewed: 06/07/2011 °ExitCare® Patient Information ©2015 [employer]. This information is not intended to replace advice given to you by your health care provider. Make sure you discuss any questions you have with your health care provider. ° °Lumbosacral Strain °Lumbosacral strain is a strain of any of the parts that make up your lumbosacral vertebrae. Your lumbosacral vertebrae are the bones that make up the lower third of your backbone. Your lumbosacral vertebrae are held together by muscles and tough, fibrous tissue (ligaments).  °CAUSES  °A sudden blow to your back can cause lumbosacral strain.  Also, anything that causes an excessive stretch of the muscles in the low back can cause this strain. This is typically seen when people exert themselves strenuously, fall, lift heavy objects, bend, or crouch repeatedly. °RISK FACTORS °· Physically demanding work. °· Participation in pushing or pulling sports or sports that require a sudden twist of the back (tennis, golf, baseball). °· Weight lifting. °· Excessive lower back curvature. °· Forward-tilted pelvis. °· Weak back or abdominal muscles or both. °· Tight hamstrings. °SIGNS AND SYMPTOMS  °Lumbosacral strain may cause pain in the area of your injury or pain that moves (radiates) down your leg.  °DIAGNOSIS °Your health care provider can often diagnose lumbosacral strain through a physical exam. In some cases, you may need tests such as X-ray exams.  °TREATMENT  °Treatment for your lower back injury depends on many factors that your clinician will have to evaluate. However, most treatment will include the use of anti-inflammatory medicines. °HOME CARE INSTRUCTIONS  °· Avoid hard physical activities (tennis, racquetball, waterskiing) if you are not in proper physical condition for it. This may aggravate or create problems. °· If you have a back problem, avoid sports requiring sudden body   movements. Swimming and walking are generally safer activities. °· Maintain good posture. °· Maintain a healthy weight. °· For acute conditions, you may put ice on the injured area. °¨ Put ice in a plastic bag. °¨ Place a towel between your skin and the bag. °¨ Leave the ice on for 20 minutes, 2-3 times a day. °· When the low back starts healing, stretching and strengthening exercises may be recommended. °SEEK MEDICAL CARE IF: °· Your back pain is getting worse. °· You experience severe back pain not relieved with medicines. °SEEK IMMEDIATE MEDICAL CARE IF:  °· You have numbness, tingling, weakness, or problems with the use of your arms or legs. °· There is a change in bowel or  bladder control. °· You have increasing pain in any area of the body, including your belly (abdomen). °· You notice shortness of breath, dizziness, or feel faint. °· You feel sick to your stomach (nauseous), are throwing up (vomiting), or become sweaty. °· You notice discoloration of your toes or legs, or your feet get very cold. °MAKE SURE YOU:  °· Understand these instructions. °· Will watch your condition. °· Will get help right away if you are not doing well or get worse. °Document Released: 04/02/2005 Document Revised: 06/28/2013 Document Reviewed: 02/09/2013 °ExitCare® Patient Information ©2015 [employer]. This information is not intended to replace advice given to you by your health care provider. Make sure you discuss any questions you have with your health care provider. ° °

## 2015-04-09 NOTE — ED Notes (Signed)
Patient complains of "severe back pain".  Reports a history of low back pain, pain for a year .  Patient has seen pcp and received medication.  Patient reports pain has increased over the last 2 weeks.  Standing and bending makes pain worse.  No known injury.

## 2015-04-09 NOTE — ED Provider Notes (Signed)
CSN: [id number]     Arrival date & time 04/09/15  1303 History   First MD Initiated Contact with Patient 04/09/15 1326     Chief Complaint  Patient presents with  . Back Pain   (Consider location/radiation/quality/duration/timing/severity/associated sxs/prior Treatment) HPI Comments: 29 year old female with a history of morbid obesity presents with acute on chronic back pain. She has intermittent back pain to the mid lumbar spine for over a year. In the past 2 weeks is been getting worse. Denies any known trauma or injury. She states that it comes and goes. The past 2 weeks it has been more persistent. She saw her PCP a month ago and was treated with Cataflam. It is not currently helping her pain. It is worse when bending and twisting and prolonged standing. Located in the mid lumbar spine and right para lumbar musculature. It does not radiate. Occasionally she feels pain in the right thigh.   Past Medical History  Diagnosis Date  . Medical history non-contributory   . Back pain    Past Surgical History  Procedure Laterality Date  . Gallbladder surgery  2007  . Cholecystectomy    . Cesarean section     No family history on file. Social History  Substance Use Topics  . Smoking status: Never Smoker   . Smokeless tobacco: Never Used  . Alcohol Use: No   OB History    Gravida Para Term Preterm AB TAB SAB Ectopic Multiple Living   0 0 0 0 0 0 3     Review of Systems  Constitutional: Positive for activity change. Negative for fever, chills and fatigue.  HENT: Negative.   Respiratory: Negative.   Cardiovascular: Negative.   Gastrointestinal: Negative.   Musculoskeletal: Positive for back pain. Negative for arthralgias and neck pain.       As per HPI  Skin: Negative for color change, pallor and rash.  Neurological: Negative.   All other systems reviewed and are negative.   Allergies  Review of patient's allergies indicates no known allergies.  Home Medications    Prior to Admission medications   Medication Sig Start Date End Date Taking? Authorizing Provider  diclofenac (VOLTAREN) 75 MG EC tablet Take 75 mg by mouth 2 (two) times daily.   Yes Historical Provider, MD  acetaminophen (TYLENOL) 325 MG tablet Take 650 mg by mouth every 6 (six) hours as needed. headache    Historical Provider, MD  diphenoxylate-atropine (LOMOTIL) 2.5-0.025 MG per tablet Take 1 tablet by mouth 4 (four) times daily as needed for diarrhea or loose stools. 10/06/13   Eber Hong, MD  ibuprofen (ADVIL,MOTRIN) 200 MG tablet Take 200 mg by mouth every 6 (six) hours as needed for pain.    Historical Provider, MD  ondansetron (ZOFRAN ODT) 4 MG disintegrating tablet Take 1 tablet (4 mg total) by mouth every 8 (eight) hours as needed for nausea. 10/06/13   Eber Hong, MD  traMADol (ULTRAM) 50 MG tablet Take 1 tablet (50 mg total) by mouth every 6 (six) hours as needed. 04/09/15   Hayden Rasmussen, NP   Meds Ordered and Administered this Visit  Medications - No data to display  BP 91/62 mmHg  Pulse 89  Temp(Src) 98.4 F (36.9 C) (Oral)  Resp 16  SpO2 99%  LMP 03/19/2015 No data found.   Physical Exam  Constitutional: She is oriented to person, place, and time. She appears well-developed and well-nourished. No distress.  HENT:  Head: Normocephalic and atraumatic.  Eyes:  EOM are normal.  Neck: Normal range of motion. Neck supple.  Cardiovascular: Normal rate.   Pulmonary/Chest: Effort normal. No respiratory distress.  Abdominal: Soft. There is no tenderness.  Musculoskeletal:  Tenderness to L3-4. And para lumbar musculature. Having patient lean forward causes pulling and pain to the same area. Distal neurovascular motor Sentry is intact. Patient is ambulatory with full weightbearing. Lower extremity strength is 5 over 5.  Neurological: She is alert and oriented to person, place, and time. No cranial nerve deficit. She exhibits normal muscle tone.  Skin: Skin is warm and dry.   Psychiatric: She has a normal mood and affect.  Nursing note and vitals reviewed.   ED Course  Procedures (including critical care time)  Labs Review Labs Reviewed - No data to display  Imaging Review No results found.   Visual Acuity Review  Right Eye Distance:   Left Eye Distance:   Bilateral Distance:    Right Eye Near:   Left Eye Near:    Bilateral Near:         MDM   1. Chronic back pain   2. Acute back pain   3. Midline low back pain without sciatica    Continue Cataflam as directed Tramadol 50 mg every 4 hours when necessary pain #15 Before mild stretches, dry ice, then heat Steffanie Dunn doctor for an appointment for follow-up. May need additional evaluation and/or physical therapy for chronic back pain.     Hayden Rasmussen, NP 04/09/15 1352

## 2015-04-17 ENCOUNTER — Encounter (HOSPITAL_COMMUNITY): Payer: Self-pay | Admitting: Emergency Medicine

## 2015-04-17 ENCOUNTER — Emergency Department (HOSPITAL_COMMUNITY)
Admission: EM | Admit: 2015-04-17 | Discharge: 2015-04-17 | Disposition: A | Discharge status: Home / Self Care | Payer: 59 | Attending: Emergency Medicine | Admitting: Emergency Medicine

## 2015-04-17 DIAGNOSIS — Z791 Long term (current) use of non-steroidal anti-inflammatories (NSAID): Secondary | ICD-10-CM | POA: Diagnosis not present

## 2015-04-17 DIAGNOSIS — G8929 Other chronic pain: Secondary | ICD-10-CM | POA: Diagnosis not present

## 2015-04-17 DIAGNOSIS — M545 Low back pain, unspecified: Secondary | ICD-10-CM

## 2015-04-17 MED ORDER — METHOCARBAMOL 500 MG PO TABS: 500.0000 mg | ORAL_TABLET | Freq: Four times a day (QID) | ORAL | Status: DC | PRN

## 2015-04-17 NOTE — ED Notes (Signed)
C/o back pain for 1 year. Worse past few weeks. Has been to PCP [facility] as well as [facility] last week for this pain. Has Tramadol from that visit. States pain is in right lower back to right leg.

## 2015-04-17 NOTE — ED Provider Notes (Signed)
CSN: [id number]     Arrival date & time 04/17/15  1013 History  By signing my name below, I, Essence Howell, attest that this documentation has been prepared under the direction and in the presence of Trixie Dredge, PA-C Electronically Signed: Charline Bills, ED Scribe 04/17/2015 at 10:44 AM.   Chief Complaint  Patient presents with  . Back Pain   The history is provided by the patient. No language interpreter was used.   HPI Comments: [name] is a 29 y.o. female, with a h/o chronic back pain, who presents to the Emergency Department complaining of acute on chronic right-sided back pain for the past year, worsened over the month. Pt was seen at [facility]gent Care on 10/3 for the same and discharged with Tramadol which she states only provides temporary relief. She describes pain as pressure that intermittently radiates into her right leg and is exacerbated with prolonged sitting, standing and bending. Pt denies heavy lifting or any recent injury. She has also tried Voltaren in the past by her PCP at [facility] without significant relief. Pt denies fever, hip pain, weakness, numbness, bladder or bowel incontinence, abdominal pain, dysuria, urinary frequency, urinary urgency, abnormal vaginal discharge or bleeding, diarrhea, constipation, blood in stool.  No changes in medications. Pt denies possible pregnancy; she had an IUD placed in January 2013. She reports normal menstrual periods; LNMP: current.   Past Medical History  Diagnosis Date  . Medical history non-contributory   . Back pain    Past Surgical History  Procedure Laterality Date  . Gallbladder surgery  2007  . Cholecystectomy    . Cesarean section     No family history on file. Social History  Substance Use Topics  . Smoking status: Never Smoker   . Smokeless tobacco: Never Used  . Alcohol Use: No   OB History    Gravida Para Term Preterm AB TAB SAB Ectopic Multiple Living   0 0 0 0 0 0 3     Review of Systems   Constitutional: Negative for fever.  Gastrointestinal: Negative for abdominal pain, diarrhea, constipation and blood in stool.  Genitourinary: Negative for dysuria, urgency, frequency, vaginal discharge and menstrual problem.  Musculoskeletal: Positive for back pain. Negative for arthralgias.  Neurological: Negative for weakness and numbness.  All other systems reviewed and are negative.  Allergies  Review of patient's allergies indicates no known allergies.  Home Medications   Prior to Admission medications   Medication Sig Start Date End Date Taking? Authorizing Provider  acetaminophen (TYLENOL) 325 MG tablet Take 650 mg by mouth every 6 (six) hours as needed. headache    Historical Provider, MD  diclofenac (VOLTAREN) 75 MG EC tablet Take 75 mg by mouth 2 (two) times daily.    Historical Provider, MD  diphenoxylate-atropine (LOMOTIL) 2.5-0.025 MG per tablet Take 1 tablet by mouth 4 (four) times daily as needed for diarrhea or loose stools. 10/06/13   Eber Hong, MD  ibuprofen (ADVIL,MOTRIN) 200 MG tablet Take 200 mg by mouth every 6 (six) hours as needed for pain.    Historical Provider, MD  ondansetron (ZOFRAN ODT) 4 MG disintegrating tablet Take 1 tablet (4 mg total) by mouth every 8 (eight) hours as needed for nausea. 10/06/13   Eber Hong, MD  traMADol (ULTRAM) 50 MG tablet Take 1 tablet (50 mg total) by mouth every 6 (six) hours as needed. 04/09/15   Hayden Rasmussen, NP   BP 133/73 mmHg  Pulse 74  Temp(Src) 97.4  F (36.3 C) (Oral)  Resp 16  SpO2 100%  LMP 04/13/2015 (Exact Date) Physical Exam  Constitutional: She appears well-developed and well-nourished. No distress.  HENT:  Head: Normocephalic and atraumatic.  Neck: Neck supple.  Cardiovascular: Normal rate.   Pulmonary/Chest: Effort normal.  Abdominal: Soft. She exhibits no distension and no mass. There is no tenderness. There is no rebound and no guarding.  Musculoskeletal: She exhibits no edema.  Tenderness throughout R  lower back. No skin changes.  Straight leg raise is negative.  Spine nontender, no crepitus, or stepoffs. Lower extremities: Strength 5/5, sensation intact, distal pulses intact.   No lower extremity edema or tenderness.   Neurological: She is alert.  Gait is normal   Skin: She is not diaphoretic.  Nursing note and vitals reviewed.  ED Course  Procedures (including critical care time) DIAGNOSTIC STUDIES: Oxygen Saturation is 100% on RA, normal by my interpretation.    COORDINATION OF CARE: 10:40 AM-Discussed treatment plan which includes Robaxin with pt at bedside and pt agreed to plan.   Labs Review Labs Reviewed - No data to display  Imaging Review No results found. I have personally reviewed and evaluated these images and lab results as part of my medical decision-making.   EKG Interpretation None      MDM   Final diagnoses:  Low back pain radiating to right lower extremity   Afebrile, nontoxic patient with mechanical low back pain. No red flags.  No injuries.  D/C home with robaxin, PCP follow up.  Discussed result, findings, treatment, and follow up  with patient.  Pt given return precautions.  Pt verbalizes understanding and agrees with plan.       I personally performed the services described in this documentation, which was scribed in my presence. The recorded information has been reviewed and is accurate.    Trixie Dredge, PA-C 04/17/15 1102  Melene Plan, DO 04/17/15 1320

## 2015-04-17 NOTE — Discharge Instructions (Signed)
Read the information below.  Use the prescribed medication as directed.  Please discuss all new medications with your pharmacist.  You may return to the Emergency Department at any time for worsening condition or any new symptoms that concern you.   If you develop fevers, loss of control of bowel or bladder, weakness or numbness in your legs, or are unable to walk, return to the ER for a recheck.    Back Pain, Adult Back pain is very common in adults.The cause of back pain is rarely dangerous and the pain often gets better over time.The cause of your back pain may not be known. Some common causes of back pain include:  Strain of the muscles or ligaments supporting the spine.  Wear and tear (degeneration) of the spinal disks.  Arthritis.  Direct injury to the back. For many people, back pain may return. Since back pain is rarely dangerous, most people can learn to manage this condition on their own. HOME CARE INSTRUCTIONS Watch your back pain for any changes. The following actions may help to lessen any discomfort you are feeling:  Remain active. It is stressful on your back to sit or stand in one place for long periods of time. Do not sit, drive, or stand in one place for more than 30 minutes at a time. Take short walks on even surfaces as soon as you are able.Try to increase the length of time you walk each day.  Exercise regularly as directed by your health care provider. Exercise helps your back heal faster. It also helps avoid future injury by keeping your muscles strong and flexible.  Do not stay in bed.Resting more than 1-2 days can delay your recovery.  Pay attention to your body when you bend and lift. The most comfortable positions are those that put less stress on your recovering back. Always use proper lifting techniques, including:  Bending your knees.  Keeping the load close to your body.  Avoiding twisting.  Find a comfortable position to sleep. Use a firm mattress  and lie on your side with your knees slightly bent. If you lie on your back, put a pillow under your knees.  Avoid feeling anxious or stressed.Stress increases muscle tension and can worsen back pain.It is important to recognize when you are anxious or stressed and learn ways to manage it, such as with exercise.  Take medicines only as directed by your health care provider. Over-the-counter medicines to reduce pain and inflammation are often the most helpful.Your health care provider may prescribe muscle relaxant drugs.These medicines help dull your pain so you can more quickly return to your normal activities and healthy exercise.  Apply ice to the injured area:  Put ice in a plastic bag.  Place a towel between your skin and the bag.  Leave the ice on for 20 minutes, 2-3 times a day for the first 2-3 days. After that, ice and heat may be alternated to reduce pain and spasms.  Maintain a healthy weight. Excess weight puts extra stress on your back and makes it difficult to maintain good posture. SEEK MEDICAL CARE IF:  You have pain that is not relieved with rest or medicine.  You have increasing pain going down into the legs or buttocks.  You have pain that does not improve in one week.  You have night pain.  You lose weight.  You have a fever or chills. SEEK IMMEDIATE MEDICAL CARE IF:   You develop new bowel or bladder control problems.  You have unusual weakness or numbness in your arms or legs.  You develop nausea or vomiting.  You develop abdominal pain.  You feel faint.   This information is not intended to replace advice given to you by your health care provider. Make sure you discuss any questions you have with your health care provider.   Document Released: 06/23/2005 Document Revised: 07/14/2014 Document Reviewed: 10/25/2013 Elsevier Interactive Patient Education 2016 [employer]  Back Exercises The following exercises strengthen the muscles that help to  support the back. They also help to keep the lower back flexible. Doing these exercises can help to prevent back pain or lessen existing pain. If you have back pain or discomfort, try doing these exercises 2-3 times each day or as told by your health care provider. When the pain goes away, do them once each day, but increase the number of times that you repeat the steps for each exercise (do more repetitions). If you do not have back pain or discomfort, do these exercises once each day or as told by your health care provider. EXERCISES Single Knee to Chest Repeat these steps 3-5 times for each leg:  Lie on your back on a firm bed or the floor with your legs extended.  Bring one knee to your chest. Your other leg should stay extended and in contact with the floor.  Hold your knee in place by grabbing your knee or thigh.  Pull on your knee until you feel a gentle stretch in your lower back.  Hold the stretch for 10-30 seconds.  Slowly release and straighten your leg. Pelvic Tilt Repeat these steps 5-10 times:  Lie on your back on a firm bed or the floor with your legs extended.  Bend your knees so they are pointing toward the ceiling and your feet are flat on the floor.  Tighten your lower abdominal muscles to press your lower back against the floor. This motion will tilt your pelvis so your tailbone points up toward the ceiling instead of pointing to your feet or the floor.  With gentle tension and even breathing, hold this position for 5-10 seconds. Cat-Cow Repeat these steps until your lower back becomes more flexible:  Get into a hands-and-knees position on a firm surface. Keep your hands under your shoulders, and keep your knees under your hips. You may place padding under your knees for comfort.  Let your head hang down, and point your tailbone toward the floor so your lower back becomes rounded like the back of a cat.  Hold this position for 5 seconds.  Slowly lift your head  and point your tailbone up toward the ceiling so your back forms a sagging arch like the back of a cow.  Hold this position for 5 seconds. Press-Ups Repeat these steps 5-10 times:  Lie on your abdomen (face-down) on the floor.  Place your palms near your head, about shoulder-width apart.  While you keep your back as relaxed as possible and keep your hips on the floor, slowly straighten your arms to raise the top half of your body and lift your shoulders. Do not use your back muscles to raise your upper torso. You may adjust the placement of your hands to make yourself more comfortable.  Hold this position for 5 seconds while you keep your back relaxed.  Slowly return to lying flat on the floor. Bridges Repeat these steps 10 times:  Lie on your back on a firm surface.  Bend your knees so  they are pointing toward the ceiling and your feet are flat on the floor.  Tighten your buttocks muscles and lift your buttocks off of the floor until your waist is at almost the same height as your knees. You should feel the muscles working in your buttocks and the back of your thighs. If you do not feel these muscles, slide your feet 1-2 inches farther away from your buttocks.  Hold this position for 3-5 seconds.  Slowly lower your hips to the starting position, and allow your buttocks muscles to relax completely. If this exercise is too easy, try doing it with your arms crossed over your chest. Abdominal Crunches Repeat these steps 5-10 times:  Lie on your back on a firm bed or the floor with your legs extended.  Bend your knees so they are pointing toward the ceiling and your feet are flat on the floor.  Cross your arms over your chest.  Tip your chin slightly toward your chest without bending your neck.  Tighten your abdominal muscles and slowly raise your trunk (torso) high enough to lift your shoulder blades a tiny bit off of the floor. Avoid raising your torso higher than that, because  it can put too much stress on your low back and it does not help to strengthen your abdominal muscles.  Slowly return to your starting position. Back Lifts Repeat these steps 5-10 times: 1. Lie on your abdomen (face-down) with your arms at your sides, and rest your forehead on the floor. 2. Tighten the muscles in your legs and your buttocks. 3. Slowly lift your chest off of the floor while you keep your hips pressed to the floor. Keep the back of your head in line with the curve in your back. Your eyes should be looking at the floor. 4. Hold this position for 3-5 seconds. 5. Slowly return to your starting position. SEEK MEDICAL CARE IF:  Your back pain or discomfort gets much worse when you do an exercise.  Your back pain or discomfort does not lessen within 2 hours after you exercise. If you have any of these problems, stop doing these exercises right away. Do not do them again unless your health care provider says that you can. SEEK IMMEDIATE MEDICAL CARE IF:  You develop sudden, severe back pain. If this happens, stop doing the exercises right away. Do not do them again unless your health care provider says that you can.   This information is not intended to replace advice given to you by your health care provider. Make sure you discuss any questions you have with your health care provider.   Document Released: 07/31/2004 Document Revised: 03/14/2015 Document Reviewed: 08/17/2014 Elsevier Interactive Patient Education 2016 [place] Injury Prevention Back injuries can be very painful. They can also be difficult to heal. After having one back injury, you are more likely to injure your back again. It is important to learn how to avoid injuring or re-injuring your back. The following tips can help you to prevent a back injury. WHAT SHOULD I KNOW ABOUT PHYSICAL FITNESS?  Exercise for 30 minutes per day on most days of the week or as directed by your health care provider. Make  sure to:  Do aerobic exercises, such as walking, jogging, biking, or swimming.  Do exercises that increase balance and strength, such as tai chi and yoga. These can decrease your risk of falling and injuring your back.  Do stretching exercises to help with flexibility.  Try  to develop strong abdominal muscles. Your abdominal muscles provide a lot of the support that is needed by your back.  Maintain a healthy weight. This helps to decrease your risk of a back injury. WHAT SHOULD I KNOW ABOUT MY DIET?  Talk with your health care provider about your overall diet. Take supplements and vitamins only as directed by your health care provider.  Talk with your health care provider about how much calcium and vitamin D you need each day. These nutrients help to prevent weakening of the bones (osteoporosis). Osteoporosis can cause broken (fractured) bones, which lead to back pain.  Include good sources of calcium in your diet, such as dairy products, green leafy vegetables, and products that have had calcium added to them (fortified).  Include good sources of vitamin D in your diet, such as milk and foods that are fortified with vitamin D. WHAT SHOULD I KNOW ABOUT MY POSTURE?  Sit up straight and stand up straight. Avoid leaning forward when you sit or hunching over when you stand.  Choose chairs that have good low-back (lumbar) support.  If you work at a desk, sit close to it so you do not need to lean over. Keep your chin tucked in. Keep your neck drawn back, and keep your elbows bent at a right angle. Your arms should look like the letter "L."  Sit high and close to the steering wheel when you drive. Add a lumbar support to your car seat, if needed.  Avoid sitting or standing in one position for very long. Take breaks to get up, stretch, and walk around at least one time every hour. Take breaks every hour if you are driving for long periods of time.  Sleep on your side with your knees  slightly bent, or sleep on your back with a pillow under your knees. Do not lie on the front of your body to sleep. WHAT SHOULD I KNOW ABOUT LIFTING, TWISTING, AND REACHING? Lifting and Heavy Lifting  Avoid heavy lifting, especially repetitive heavy lifting. If you must do heavy lifting:  Stretch before lifting.  Work slowly.  Rest between lifts.  Use a tool such as a cart or a dolly to move objects if one is available.  Make several small trips instead of carrying one heavy load.  Ask for help when you need it, especially when moving big objects.  Follow these steps when lifting:  Stand with your feet shoulder-width apart.  Get as close to the object as you can. Do not try to pick up a heavy object that is far from your body.  Use handles or lifting straps if they are available.  Bend at your knees. Squat down, but keep your heels off the floor.  Keep your shoulders pulled back, your chin tucked in, and your back straight.  Lift the object slowly while you tighten the muscles in your legs, abdomen, and buttocks. Keep the object as close to the center of your body as possible.  Follow these steps when putting down a heavy load:  Stand with your feet shoulder-width apart.  Lower the object slowly while you tighten the muscles in your legs, abdomen, and buttocks. Keep the object as close to the center of your body as possible.  Keep your shoulders pulled back, your chin tucked in, and your back straight.  Bend at your knees. Squat down, but keep your heels off the floor.  Use handles or lifting straps if they are available. Twisting and Reaching  Avoid lifting heavy objects above your waist.  Do not twist at your waist while you are lifting or carrying a load. If you need to turn, move your feet.  Do not bend over without bending at your knees.  Avoid reaching over your head, across a table, or for an object on a high surface. WHAT ARE SOME OTHER TIPS?  Avoid wet  floors and icy ground. Keep sidewalks clear of ice to prevent falls.  Do not sleep on a mattress that is too soft or too hard.  Keep items that are used frequently within easy reach.  Put heavier objects on shelves at waist level, and put lighter objects on lower or higher shelves.  Find ways to decrease your stress, such as exercise, massage, or relaxation techniques. Stress can build up in your muscles. Tense muscles are more vulnerable to injury.  Talk with your health care provider if you feel anxious or depressed. These conditions can make back pain worse.  Wear flat heel shoes with cushioned soles.  Avoid sudden movements.  Use both shoulder straps when carrying a backpack.  Do not use any tobacco products, including cigarettes, chewing tobacco, or electronic cigarettes. If you need help quitting, ask your health care provider.   This information is not intended to replace advice given to you by your health care provider. Make sure you discuss any questions you have with your health care provider.   Document Released: 07/31/2004 Document Revised: 11/07/2014 Document Reviewed: 06/27/2014 Elsevier Interactive Patient Education [employer].

## 2016-04-03 ENCOUNTER — Inpatient Hospital Stay (HOSPITAL_COMMUNITY)
Admission: AD | Admit: 2016-04-03 | Discharge: 2016-04-03 | Disposition: A | Discharge status: Home / Self Care | Payer: BLUE CROSS/BLUE SHIELD | Source: Ambulatory Visit | Attending: Obstetrics & Gynecology | Admitting: Obstetrics & Gynecology

## 2016-04-03 ENCOUNTER — Inpatient Hospital Stay (HOSPITAL_COMMUNITY): Payer: BLUE CROSS/BLUE SHIELD

## 2016-04-03 ENCOUNTER — Encounter (HOSPITAL_COMMUNITY): Payer: Self-pay | Admitting: *Deleted

## 2016-04-03 DIAGNOSIS — N923 Ovulation bleeding: Secondary | ICD-10-CM

## 2016-04-03 DIAGNOSIS — Z3202 Encounter for pregnancy test, result negative: Secondary | ICD-10-CM | POA: Diagnosis not present

## 2016-04-03 DIAGNOSIS — N939 Abnormal uterine and vaginal bleeding, unspecified: Secondary | ICD-10-CM

## 2016-04-03 LAB — WET PREP, GENITAL
Clue Cells Wet Prep HPF POC: NONE SEEN
SPERM: NONE SEEN
Trich, Wet Prep: NONE SEEN
YEAST WET PREP: NONE SEEN

## 2016-04-03 LAB — CBC
HCT: 34.4 % — ABNORMAL LOW (ref 36.0–46.0)
HEMOGLOBIN: 11.8 g/dL — AB (ref 12.0–15.0)
MCH: 29.6 pg (ref 26.0–34.0)
MCHC: 34.3 g/dL (ref 30.0–36.0)
MCV: 86.4 fL (ref 78.0–100.0)
Platelets: 372 10*3/uL (ref 150–400)
RBC: 3.98 MIL/uL (ref 3.87–5.11)
RDW: 13.3 % (ref 11.5–15.5)
WBC: 7.5 10*3/uL (ref 4.0–10.5)

## 2016-04-03 LAB — URINALYSIS, ROUTINE W REFLEX MICROSCOPIC
Bilirubin Urine: NEGATIVE
GLUCOSE, UA: NEGATIVE mg/dL
KETONES UR: NEGATIVE mg/dL
LEUKOCYTES UA: NEGATIVE
NITRITE: NEGATIVE
PROTEIN: 30 mg/dL — AB
Specific Gravity, Urine: 1.015 (ref 1.005–1.030)
pH: 7.5 (ref 5.0–8.0)

## 2016-04-03 LAB — URINE MICROSCOPIC-ADD ON: WBC UA: NONE SEEN WBC/hpf (ref 0–5)

## 2016-04-03 LAB — POCT PREGNANCY, URINE: PREG TEST UR: NEGATIVE

## 2016-04-03 MED ORDER — MEGESTROL ACETATE 40 MG PO TABS: 40.0000 mg | ORAL_TABLET | Freq: Two times a day (BID) | ORAL | 0 refills | Status: DC

## 2016-04-03 NOTE — MAU Note (Signed)
Pt has been bleeding for 3 weeks, has an IUD.  Has occasional cramping.  Has changed 3 pads today, passing some clots.  Felt dizzy for the last couple of days, not so much today.

## 2016-04-03 NOTE — Discharge Instructions (Signed)

## 2016-04-03 NOTE — MAU Provider Note (Signed)
History     CSN: [id number]  Arrival date and time: 04/03/16 1438   First Provider Initiated Contact with Patient 04/03/16 1613      Chief Complaint  Patient presents with  . Vaginal Bleeding   Non-pregnant female here with c/o VB x3 days. She reports nml menses on 03/14/16 then continued to bleed since. She reports changing 5-6 pads per day, everyday. She denies abdominal pain or cramping. She is contracepting with Paragard x4 years. She has no new sexual partners.    Pertinent Gynecological History: Menses: 03/14/16 Bleeding: intermenstrual bleeding Contraception: IUD Sexually transmitted diseases: no past history  Last pap: normal Date: 2016   Past Medical History:  Diagnosis Date  . Back pain   . Medical history non-contributory     Past Surgical History:  Procedure Laterality Date  . CESAREAN SECTION    . CHOLECYSTECTOMY    . GALLBLADDER SURGERY  2007    History reviewed. No pertinent family history.  Social History  Substance Use Topics  . Smoking status: Never Smoker  . Smokeless tobacco: Never Used  . Alcohol use No    Allergies: No Known Allergies  Prescriptions Prior to Admission  Medication Sig Dispense Refill Last Dose  . acetaminophen (TYLENOL) 325 MG tablet Take 650 mg by mouth every 6 (six) hours as needed. headache   Unknown at Unknown time  . diclofenac (VOLTAREN) 75 MG EC tablet Take 75 mg by mouth 2 (two) times daily.     . diphenoxylate-atropine (LOMOTIL) 2.5-0.025 MG per tablet Take 1 tablet by mouth 4 (four) times daily as needed for diarrhea or loose stools. 30 tablet 0 Unknown at Unknown time  . ibuprofen (ADVIL,MOTRIN) 200 MG tablet Take 200 mg by mouth every 6 (six) hours as needed for pain.   Unknown at Unknown time  . methocarbamol (ROBAXIN) 500 MG tablet Take 1-2 tablets (500-1,000 mg total) by mouth every 6 (six) hours as needed. 20 tablet 0   . ondansetron (ZOFRAN ODT) 4 MG disintegrating tablet Take 1 tablet (4 mg total) by mouth  every 8 (eight) hours as needed for nausea. 10 tablet 0 Unknown at Unknown time  . traMADol (ULTRAM) 50 MG tablet Take 1 tablet (50 mg total) by mouth every 6 (six) hours as needed. 15 tablet 0     Review of Systems  Constitutional: Negative.   Gastrointestinal: Negative.    Physical Exam   Blood pressure 115/67, pulse 96, temperature 97.6 F (36.4 C), temperature source Oral, resp. rate 18, height 5\' 6"  (1.676 m), weight 130.6 kg (288 lb), last menstrual period 03/14/2016, currently breastfeeding.  Physical Exam  Constitutional: She is oriented to person, place, and time. She appears well-developed and well-nourished.  HENT:  Head: Normocephalic and atraumatic.  Neck: Normal range of motion. Neck supple.  Cardiovascular: Normal rate.   Respiratory: Effort normal.  GI: Soft. She exhibits no distension and no mass. There is no tenderness. There is no rebound and no guarding.  Genitourinary:  Genitourinary Comments: External: no lesions Vagina: rugated, parous, moderate bloody discharge, small clots, strings visualized Uterus: mildly enlarged, anteverted, non tender, no CMT Adnexae: no masses, no tenderness left, no tenderness right   Musculoskeletal: Normal range of motion.  Neurological: She is alert and oriented to person, place, and time.  Skin: Skin is warm and dry.  Psychiatric: She has a normal mood and affect.   Results for orders placed or performed during the hospital encounter of 04/03/16 (from the past 24 hour(s))  Urinalysis, Routine w reflex microscopic (not at [facility])     Status: Abnormal   Collection Time: 04/03/16  2:57 PM  Result Value Ref Range   Color, Urine YELLOW YELLOW   APPearance CLOUDY (A) CLEAR   Specific Gravity, Urine 1.015 1.005 - 1.030   pH 7.5 5.0 - 8.0   Glucose, UA NEGATIVE NEGATIVE mg/dL   Hgb urine dipstick LARGE (A) NEGATIVE   Bilirubin Urine NEGATIVE NEGATIVE   Ketones, ur NEGATIVE NEGATIVE mg/dL   Protein, ur 30 (A) NEGATIVE mg/dL    Nitrite NEGATIVE NEGATIVE   Leukocytes, UA NEGATIVE NEGATIVE  Urine microscopic-add on     Status: Abnormal   Collection Time: 04/03/16  2:57 PM  Result Value Ref Range   Squamous Epithelial / LPF 6-30 (A) NONE SEEN   WBC, UA NONE SEEN 0 - 5 WBC/hpf   RBC / HPF TOO NUMEROUS TO COUNT 0 - 5 RBC/hpf   Bacteria, UA MANY (A) NONE SEEN   Urine-Other AMORPHOUS URATES/PHOSPHATES   Pregnancy, urine POC     Status: None   Collection Time: 04/03/16  3:12 PM  Result Value Ref Range   Preg Test, Ur NEGATIVE NEGATIVE  Wet prep, genital     Status: Abnormal   Collection Time: 04/03/16  4:28 PM  Result Value Ref Range   Yeast Wet Prep HPF POC NONE SEEN NONE SEEN   Trich, Wet Prep NONE SEEN NONE SEEN   Clue Cells Wet Prep HPF POC NONE SEEN NONE SEEN   WBC, Wet Prep HPF POC MODERATE (A) NONE SEEN   Sperm NONE SEEN   CBC     Status: Abnormal   Collection Time: 04/03/16  4:51 PM  Result Value Ref Range   WBC 7.5 4.0 - 10.5 K/uL   RBC 3.98 3.87 - 5.11 MIL/uL   Hemoglobin 11.8 (L) 12.0 - 15.0 g/dL   HCT 91.4 (L) 78.2 - 95.6 %   MCV 86.4 78.0 - 100.0 fL   MCH 29.6 26.0 - 34.0 pg   MCHC 34.3 30.0 - 36.0 g/dL   RDW 21.3 08.6 - 57.8 %   Platelets 372 150 - 400 K/uL   US Transvaginal Non-ob  Result Date: 04/03/2016 CLINICAL DATA:  Abnormal uterine bleeding for 3 weeks. Pelvic pain and cramping for 1 week. LMP 02/19/2016. EXAM: TRANSABDOMINAL AND TRANSVAGINAL ULTRASOUND OF PELVIS TECHNIQUE: Both transabdominal and transvaginal ultrasound examinations of the pelvis were performed. Transabdominal technique was performed for global imaging of the pelvis including uterus, ovaries, adnexal regions, and pelvic cul-de-sac. It was necessary to proceed with endovaginal exam following the transabdominal exam to visualize the IUD in left adnexal cystic lesion. COMPARISON:  None FINDINGS: Uterus Measurements: 9.7 x 5.3 x 6.9 cm. No fibroids or other mass visualized. Endometrium Thickness: Not well visualized due to  IUD. IUD is visualized within the endometrial cavity, although possibly malrotated, with right side arm appearing positioned below the right cornua on 3D imaging. Right ovary Measurements: 2.9 x 1.6 x 2.0 cm. Normal appearance/no adnexal mass. Left ovary Measurements: 4.3 x 2.9 x 4.0 cm. Mature follicular cyst measuring 4.0 cm in maximum diameter. Other findings No abnormal free fluid. IMPRESSION: IUD visualized within endometrial cavity, with probable malpositioning of right side arm below the right cornual region. 4 cm left ovarian follicular cyst. Electronically Signed   By: Myles Rosenthal M.D.   On: 04/03/2016 18:14   US Pelvis Complete  Result Date: 04/03/2016 CLINICAL DATA:  Abnormal uterine bleeding for 3 weeks. Pelvic pain  and cramping for 1 week. LMP 02/19/2016. EXAM: TRANSABDOMINAL AND TRANSVAGINAL ULTRASOUND OF PELVIS TECHNIQUE: Both transabdominal and transvaginal ultrasound examinations of the pelvis were performed. Transabdominal technique was performed for global imaging of the pelvis including uterus, ovaries, adnexal regions, and pelvic cul-de-sac. It was necessary to proceed with endovaginal exam following the transabdominal exam to visualize the IUD in left adnexal cystic lesion. COMPARISON:  None FINDINGS: Uterus Measurements: 9.7 x 5.3 x 6.9 cm. No fibroids or other mass visualized. Endometrium Thickness: Not well visualized due to IUD. IUD is visualized within the endometrial cavity, although possibly malrotated, with right side arm appearing positioned below the right cornua on 3D imaging. Right ovary Measurements: 2.9 x 1.6 x 2.0 cm. Normal appearance/no adnexal mass. Left ovary Measurements: 4.3 x 2.9 x 4.0 cm. Mature follicular cyst measuring 4.0 cm in maximum diameter. Other findings No abnormal free fluid. IMPRESSION: IUD visualized within endometrial cavity, with probable malpositioning of right side arm below the right cornual region. 4 cm left ovarian follicular cyst. Electronically  Signed   By: Myles Rosenthal M.D.   On: 04/03/2016 18:14    MAU Course  Procedures  MDM Labs and [place] ordered and reviewed. No evidence of acute pelvic process. Will treat with Megace. Stable for discharge home.  Assessment and Plan   1. Abnormal uterine bleeding (AUB)   2. Intermenstrual bleeding    Discharge home Follow up at [facility] in 3 weeks as scheduled    Medication List    STOP taking these medications   diphenoxylate-atropine 2.5-0.025 MG tablet Commonly known as:  LOMOTIL   methocarbamol 500 MG tablet Commonly known as:  ROBAXIN   ondansetron 4 MG disintegrating tablet Commonly known as:  ZOFRAN ODT   traMADol 50 MG tablet Commonly known as:  ULTRAM     TAKE these medications   acetaminophen 325 MG tablet Commonly known as:  TYLENOL Take 650 mg by mouth every 6 (six) hours as needed. headache   diclofenac 75 MG EC tablet Commonly known as:  VOLTAREN Take 75 mg by mouth 2 (two) times daily.   ibuprofen 200 MG tablet Commonly known as:  ADVIL,MOTRIN Take 200 mg by mouth every 6 (six) hours as needed for pain.   megestrol 40 MG tablet Commonly known as:  MEGACE Take 1 tablet (40 mg total) by mouth 2 (two) times daily.       Donette Larry, CNM 04/03/2016, 4:37 PM

## 2016-04-04 LAB — GC/CHLAMYDIA PROBE AMP (~~LOC~~) NOT AT ARMC
CHLAMYDIA, DNA PROBE: NEGATIVE
NEISSERIA GONORRHEA: NEGATIVE

## 2016-05-04 ENCOUNTER — Encounter (HOSPITAL_COMMUNITY): Payer: Self-pay | Admitting: Certified Nurse Midwife

## 2016-05-04 ENCOUNTER — Inpatient Hospital Stay (HOSPITAL_COMMUNITY)
Admission: AD | Admit: 2016-05-04 | Discharge: 2016-05-04 | Disposition: A | Discharge status: Home / Self Care | Payer: BLUE CROSS/BLUE SHIELD | Source: Ambulatory Visit | Attending: Obstetrics & Gynecology | Admitting: Obstetrics & Gynecology

## 2016-05-04 DIAGNOSIS — Z3202 Encounter for pregnancy test, result negative: Secondary | ICD-10-CM | POA: Insufficient documentation

## 2016-05-04 DIAGNOSIS — N938 Other specified abnormal uterine and vaginal bleeding: Secondary | ICD-10-CM | POA: Diagnosis present

## 2016-05-04 DIAGNOSIS — D649 Anemia, unspecified: Secondary | ICD-10-CM

## 2016-05-04 LAB — URINE MICROSCOPIC-ADD ON

## 2016-05-04 LAB — URINALYSIS, ROUTINE W REFLEX MICROSCOPIC
Bilirubin Urine: NEGATIVE
GLUCOSE, UA: NEGATIVE mg/dL
Ketones, ur: NEGATIVE mg/dL
Leukocytes, UA: NEGATIVE
NITRITE: NEGATIVE
PH: 8.5 — AB (ref 5.0–8.0)
PROTEIN: 30 mg/dL — AB
Specific Gravity, Urine: 1.015 (ref 1.005–1.030)

## 2016-05-04 LAB — CBC
HCT: 31 % — ABNORMAL LOW (ref 36.0–46.0)
Hemoglobin: 10.7 g/dL — ABNORMAL LOW (ref 12.0–15.0)
MCH: 28.9 pg (ref 26.0–34.0)
MCHC: 34.5 g/dL (ref 30.0–36.0)
MCV: 83.8 fL (ref 78.0–100.0)
Platelets: 425 10*3/uL — ABNORMAL HIGH (ref 150–400)
RBC: 3.7 MIL/uL — ABNORMAL LOW (ref 3.87–5.11)
RDW: 12.7 % (ref 11.5–15.5)
WBC: 7.6 10*3/uL (ref 4.0–10.5)

## 2016-05-04 LAB — POCT PREGNANCY, URINE: Preg Test, Ur: NEGATIVE

## 2016-05-04 MED ORDER — MEGESTROL ACETATE 40 MG PO TABS: 40.0000 mg | ORAL_TABLET | Freq: Two times a day (BID) | ORAL | 5 refills | Status: DC

## 2016-05-04 MED ORDER — FERROUS SULFATE 325 (65 FE) MG PO TABS: 325.0000 mg | ORAL_TABLET | Freq: Every day | ORAL | 1 refills | Status: DC

## 2016-05-04 NOTE — MAU Provider Note (Signed)
Chief Complaint:  No chief complaint on file.   First Provider Initiated Contact with Patient 05/04/16 1350     HPI: [name] is a 30 y.o. G3P3003 who presents to maternity admissions reporting dysfunctional uterine bleeding.  Was seen initially in late September and treated with Megace. Had a Paragard IUD then.  Later went to [facility] and was put on OCPs with little benefit. They took her IUD out last week.  Continues to soak a pad every hour.  Some cramping. .Some lightheadedness. She reports vaginal bleeding, no vaginal itching/burning, urinary symptoms, h/a, dizziness, n/v, or fever/chills.    Vaginal Bleeding  The patient's primary symptoms include pelvic pain and vaginal bleeding. The patient's pertinent negatives include no genital itching, genital lesions or genital odor. This is a recurrent problem. The current episode started more than 1 month ago. The problem has been unchanged. The pain is mild. The problem affects both sides. She is not pregnant. Pertinent negatives include no back pain, chills, constipation, diarrhea, fever, nausea or vomiting. The vaginal discharge was bloody. The vaginal bleeding is heavier than menses. She has been passing clots. She has not been passing tissue. Nothing aggravates the symptoms. Treatments tried: Megace, OCPs. The treatment provided no relief. She uses oral contraceptives for contraception.   RN Note: Started bleeding around 03/14/16, but was finishing her menses.  Seen on 04/03/16 ran tests, u/s did not find anything given a  Medication to stop bleeding.  [facility]  Had paraguard for 5 yrs, and took it out and gave BCP's and was told if bleeding not better by Friday to be seen.  Today having pain under abdomen, and heavy bleeding started again changing pad 20 to 30 minutes, feeling a little lightheadness   Past Medical History: Past Medical History:  Diagnosis Date  . Back pain   . Medical history non-contributory      Past obstetric history: OB History  Gravida Para Term Preterm AB Living  3 3 3  0 0 3  SAB TAB Ectopic Multiple Live Births  0 0 0 0 3    # Outcome Date GA Lbr Len/2nd Weight Sex Delivery Anes PTL Lv  3 Term 05/28/11 [id number] 13:32 / 02:37 9 lb 9.4 oz (4.349 kg) M Vag-Spont EPI  LIV  2 Term           1 Term      CS-LTranv EPI        Past Surgical History: Past Surgical History:  Procedure Laterality Date  . CESAREAN SECTION    . CHOLECYSTECTOMY    . GALLBLADDER SURGERY  2007    Family History: History reviewed. No pertinent family history.  Social History: Social History  Substance Use Topics  . Smoking status: Never Smoker  . Smokeless tobacco: Never Used  . Alcohol use No    Allergies: No Known Allergies  Meds:  Prescriptions Prior to Admission  Medication Sig Dispense Refill Last Dose  . acetaminophen (TYLENOL) 325 MG tablet Take 650 mg by mouth every 6 (six) hours as needed. headache   Past Month at Unknown time  . cetirizine (ZYRTEC) 10 MG tablet Take 10 mg by mouth daily.   Past Month at Unknown time  . ibuprofen (ADVIL,MOTRIN) 200 MG tablet Take 200 mg by mouth every 6 (six) hours as needed for pain.   Past Month at Unknown time  . megestrol (MEGACE) 40 MG tablet Take 1 tablet (40 mg total) by mouth 2 (two) times  daily. 60 tablet 0 05/04/2016 at Unknown time    I have reviewed patient's Past Medical Hx, Surgical Hx, Family Hx, Social Hx, medications and allergies.  ROS:  Review of Systems  Constitutional: Negative for chills and fever.  Gastrointestinal: Negative for constipation, diarrhea, nausea and vomiting.  Genitourinary: Positive for pelvic pain and vaginal bleeding.  Musculoskeletal: Negative for back pain.   Other systems negative     Physical Exam  Patient Vitals for the past 24 hrs:  BP Temp Temp src Pulse Resp Height Weight  05/04/16 1351 121/70 98.3 F (36.8 C) Oral 97 18 5' 6.5" (1.689 m) 282 lb 9.6 oz (128.2 kg)   Constitutional:  Well-developed, well-nourished female in no acute distress.  Cardiovascular: normal rate and rhythm, no ectopy audible, S1 & S2 heard, no murmur Respiratory: normal effort, no distress. Lungs CTAB with no wheezes or crackles GI: Abd soft, non-tender.  Nondistended.  No rebound, No guarding.  Bowel Sounds audible  MS: Extremities nontender, no edema, normal ROM Neurologic: Alert and oriented x 4.   Grossly nonfocal. GU: Neg CVAT. Skin:  Warm and Dry Psych:  Affect appropriate.  PELVIC EXAM: Cervix pink, without lesion, moderate thin bloody discharge, vaginal walls and external genitalia normal Bimanual exam: Cervix firm, anterior, slightly open, neg CMT, uterus nontender, nonenlarged, adnexa without tenderness, enlargement, or mass    Labs: Results for orders placed or performed during the hospital encounter of 05/04/16 (from the past 24 hour(s))  CBC     Status: Abnormal   Collection Time: 05/04/16  2:11 PM  Result Value Ref Range   WBC 7.6 4.0 - 10.5 K/uL   RBC 3.70 (L) 3.87 - 5.11 MIL/uL   Hemoglobin 10.7 (L) 12.0 - 15.0 g/dL   HCT 16.1 (L) 09.6 - 04.5 %   MCV 83.8 78.0 - 100.0 fL   MCH 28.9 26.0 - 34.0 pg   MCHC 34.5 30.0 - 36.0 g/dL   RDW 40.9 81.1 - 91.4 %   Platelets 425 (H) 150 - 400 K/uL  Pregnancy, urine POC     Status: None   Collection Time: 05/04/16  2:41 PM  Result Value Ref Range   Preg Test, Ur NEGATIVE NEGATIVE       Ref. Range 04/03/2016 16:51  Hemoglobin Latest Ref Range: 12.0 - 15.0 g/dL 78.2 (L)    Imaging:  No results found.  MAU Course/MDM: I have ordered labs as follows:  CBC, UPT Imaging ordered: none Results reviewed.   Consult Dr Erin Fulling. She thinks it is probably related to the IUD, so now that it has been removed, she should improve. She recommends replacing OCPs with Megace 40mg  bid.  Should improve over the next 2 weeks. She reviewed her [place] and did not see evidence of a polyp.  Recommend f/u with [facility].  Pt stable at time of  discharge.  Assessment: 1. DUB (dysfunctional uterine bleeding)   2.    Mild anemia  Plan: Discharge home Recommend Start once daily iron.  Restart Megace, new Rx provided Rx sent for Megace for Bleeding Recommend taking Ibuprofen for 2 days round the clock to assist in pain and bleeding due to antiprostaglandin effect. Follow up at [facility] for evaluation of efficacy  Follow-up Information    [facility]. Schedule an appointment as soon as possible for a visit today.   Contact information: [address] [place] [address] [phone]           Encouraged to return here or  to other Urgent Care/ED if she develops worsening of symptoms, increase in pain, fever, or other concerning symptoms.   Wynelle BourgeoisMarie Murad Staples CNM, MSN Certified Nurse-Midwife 05/04/2016 2:55 PM

## 2016-05-04 NOTE — Discharge Instructions (Signed)

## 2016-05-04 NOTE — MAU Note (Signed)
Started bleeding around 03/14/16, but was finishing her menses.  Seen on 04/03/16 ran tests, u/s did not find anything given a  Medication to stop bleeding.  [facility]  Had paraguard for 5 yrs, and took it out and gave BCP's and was told if bleeding not better by Friday to be seen.  Today having pain under abdomen, and heavy bleeding started again changing pad 20 to 30 minutes, feeling a little lightheadness

## 2016-10-14 ENCOUNTER — Inpatient Hospital Stay (HOSPITAL_COMMUNITY)
Admission: AD | Admit: 2016-10-14 | Discharge: 2016-10-14 | Disposition: A | Discharge status: Home / Self Care | Payer: BLUE CROSS/BLUE SHIELD | Source: Ambulatory Visit | Attending: Family Medicine | Admitting: Family Medicine

## 2016-10-14 ENCOUNTER — Encounter (HOSPITAL_COMMUNITY): Payer: Self-pay | Admitting: *Deleted

## 2016-10-14 DIAGNOSIS — O99611 Diseases of the digestive system complicating pregnancy, first trimester: Secondary | ICD-10-CM | POA: Insufficient documentation

## 2016-10-14 DIAGNOSIS — K219 Gastro-esophageal reflux disease without esophagitis: Secondary | ICD-10-CM | POA: Diagnosis not present

## 2016-10-14 DIAGNOSIS — Z3A08 8 weeks gestation of pregnancy: Secondary | ICD-10-CM | POA: Diagnosis not present

## 2016-10-14 DIAGNOSIS — Z79899 Other long term (current) drug therapy: Secondary | ICD-10-CM | POA: Insufficient documentation

## 2016-10-14 DIAGNOSIS — O210 Mild hyperemesis gravidarum: Secondary | ICD-10-CM | POA: Insufficient documentation

## 2016-10-14 DIAGNOSIS — R112 Nausea with vomiting, unspecified: Secondary | ICD-10-CM | POA: Diagnosis present

## 2016-10-14 LAB — URINALYSIS, ROUTINE W REFLEX MICROSCOPIC
Bilirubin Urine: NEGATIVE
GLUCOSE, UA: NEGATIVE mg/dL
Hgb urine dipstick: NEGATIVE
Ketones, ur: NEGATIVE mg/dL
LEUKOCYTES UA: NEGATIVE
Nitrite: NEGATIVE
PH: 7 (ref 5.0–8.0)
Protein, ur: NEGATIVE mg/dL
SPECIFIC GRAVITY, URINE: 1.019 (ref 1.005–1.030)

## 2016-10-14 LAB — POCT PREGNANCY, URINE: Preg Test, Ur: POSITIVE — AB

## 2016-10-14 MED ORDER — RANITIDINE HCL 150 MG PO TABS: 150.0000 mg | ORAL_TABLET | Freq: Two times a day (BID) | ORAL | 3 refills | Status: DC

## 2016-10-14 MED ORDER — PROMETHAZINE HCL 12.5 MG PO TABS: 12.5000 mg | ORAL_TABLET | Freq: Four times a day (QID) | ORAL | 0 refills | Status: DC | PRN

## 2016-10-14 MED ORDER — SUCRALFATE 1 GM/10ML PO SUSP
1.0000 g | Freq: Once | ORAL | Status: AC
  Administered 2016-10-14: 1 g via ORAL
  Filled 2016-10-14: qty 10

## 2016-10-14 MED ORDER — PROMETHAZINE HCL 25 MG PO TABS
12.5000 mg | ORAL_TABLET | Freq: Once | ORAL | Status: AC
  Administered 2016-10-14: 12.5 mg via ORAL
  Filled 2016-10-14: qty 1

## 2016-10-14 NOTE — MAU Note (Signed)
Patient c/o vomiting that started yesterday; endorses vomiting about 6 times in the last 24 hours. Reports she is unable to hold anything down at this time. States having mid chest and lower abdominal pain that presented following the vomiting episodes; rating pain about a 6--worse with vomiting. Denies vaginal bleeding or discharge at this time.

## 2016-10-14 NOTE — MAU Provider Note (Signed)
History     CSN: [id number]  Arrival date and time: 10/14/16 1478   First Provider Initiated Contact with Patient 10/14/16 1111      Chief Complaint  Patient presents with  . Emesis   Patient is a 31 year old G4 P3 at 8 weeks and 6 days by LMP who presents today for worsening of morning sickness. She reports she has not yet established care but [facility]. She reports she's had some nausea with morning vomiting since finding out she is pregnant but over the past 24 hours is been significantly worse. She reports she's not been able to keep anything down including water. She reports she has some burning pain in the center of her chest and some abdominal pain while she is vomiting. She has not taken anything for morning sickness to this point. She is asking if maybe she could get some Zofran to help her symptoms. She reports the pain is only a 2-3 out of 10.    OB History    Gravida Para Term Preterm AB Living   0 0 3   SAB TAB Ectopic Multiple Live Births   0 0 0 0 3      Past Medical History:  Diagnosis Date  . Back pain   . Medical history non-contributory     Past Surgical History:  Procedure Laterality Date  . CESAREAN SECTION    . CHOLECYSTECTOMY    . GALLBLADDER SURGERY  2007    No family history on file.  Social History  Substance Use Topics  . Smoking status: Never Smoker  . Smokeless tobacco: Never Used  . Alcohol use No    Allergies: No Known Allergies  Prescriptions Prior to Admission  Medication Sig Dispense Refill Last Dose  . acetaminophen (TYLENOL) 325 MG tablet Take 650 mg by mouth every 6 (six) hours as needed. headache   Past Month at Unknown time  . cetirizine (ZYRTEC) 10 MG tablet Take 10 mg by mouth daily.   Past Month at Unknown time  . ferrous sulfate (FERROUSUL) 325 (65 FE) MG tablet Take 1 tablet (325 mg total) by mouth daily with breakfast. 60 tablet 1   . ibuprofen (ADVIL,MOTRIN) 200 MG  tablet Take 200 mg by mouth every 6 (six) hours as needed for pain.   Past Month at Unknown time  . megestrol (MEGACE) 40 MG tablet Take 1 tablet (40 mg total) by mouth 2 (two) times daily. 60 tablet 5     Review of Systems  Constitutional: Positive for chills. Negative for fever.  HENT: Positive for rhinorrhea. Negative for congestion.   Respiratory: Negative for cough, chest tightness and shortness of breath.   Cardiovascular: Negative for chest pain and palpitations.  Gastrointestinal: Positive for nausea and vomiting. Negative for abdominal distention, abdominal pain, constipation and diarrhea.  Genitourinary: Negative for difficulty urinating, dysuria and frequency.  Neurological: Negative for dizziness and weakness.   Physical Exam   Blood pressure 124/74, pulse 82, temperature 98.2 F (36.8 C), temperature source Oral, resp. rate 18, height  (1.702 m), weight 281 lb 1.3 oz (127.5 kg), last menstrual period 08/13/2016, SpO2 100 %, currently breastfeeding.  Physical Exam  Constitutional: She is oriented to person, place, and time. She appears well-developed and well-nourished.  HENT:  Head: Normocephalic and atraumatic.  Mouth/Throat: Oropharynx is clear and moist.  Cardiovascular: Normal rate and intact distal pulses.   Respiratory: Effort normal. No respiratory distress.  GI:  Soft. Bowel sounds are normal. She exhibits no distension. There is no tenderness. There is no rebound and no guarding.  Musculoskeletal: Normal range of motion. She exhibits no edema.  Neurological: She is alert and oriented to person, place, and time. No cranial nerve deficit.  Skin: Skin is warm and dry.  Psychiatric: She has a normal mood and affect. Her behavior is normal.    MAU Course  Procedures  MDM In MA U patient received 12.5 mg of Phenergan and a gram Carafate. She reports 100% improvement in her symptoms. She's been able to tolerate drinking a whole ginger ale. She has no other  complaints and is ready for discharge home.  Assessment and Plan  GERD: We'll prescribe Zantac twice a day told patient she can take Maalox or Tums for breakthrough pain. Morning sickness: Will prescribe Phenergan 12.5 mg twice a day  Ernestina Penna 10/14/2016, 11:19 AM

## 2016-10-14 NOTE — Discharge Instructions (Signed)
Safe Medications in Pregnancy  ° °Indigestion:  °Tums  °Maalox  °Mylanta  °Zantac  °Pepcid  ° ° ° °**If taking multiple medications, please check labels to avoid duplicating the same active ingredients  °**take medication as directed on the label  °** Do not exceed 4000 mg of tylenol in 24 hours  °**Do not take medications that contain aspirin or ibuprofen  °    °   ° ° ° ° °Food Choices for Gastroesophageal Reflux Disease, Adult °When you have gastroesophageal reflux disease (GERD), the foods you eat and your eating habits are very important. Choosing the right foods can help ease your discomfort. °What guidelines do I need to follow? °· Choose fruits, vegetables, whole grains, and low-fat dairy products. °· Choose low-fat meat, fish, and poultry. °· Limit fats such as oils, salad dressings, butter, nuts, and avocado. °· Keep a food diary. This helps you identify foods that cause symptoms. °· Avoid foods that cause symptoms. These may be different for everyone. °· Eat small meals often instead of 3 large meals a day. °· Eat your meals slowly, in a place where you are relaxed. °· Limit fried foods. °· Cook foods using methods other than frying. °· Avoid drinking alcohol. °· Avoid drinking large amounts of liquids with your meals. °· Avoid bending over or lying down until 2-3 hours after eating. °What foods are not recommended? °These are some foods and drinks that may make your symptoms worse: °Vegetables  °Tomatoes. Tomato juice. Tomato and spaghetti sauce. Chili peppers. Onion and garlic. Horseradish. °Fruits  °Oranges, grapefruit, and lemon (fruit and juice). °Meats  °High-fat meats, fish, and poultry. This includes hot dogs, ribs, ham, sausage, salami, and bacon. °Dairy  °Whole milk and chocolate milk. Sour cream. Cream. Butter. Ice cream. Cream cheese. °Drinks  °Coffee and tea. Bubbly (carbonated) drinks or energy drinks. °Condiments  °Hot sauce. Barbecue sauce. °Sweets/Desserts  °Chocolate and cocoa. Donuts.  Peppermint and spearmint. °Fats and Oils  °High-fat foods. This includes French fries and potato chips. °Other  °Vinegar. Strong spices. This includes black pepper, white pepper, red pepper, cayenne, curry powder, cloves, ginger, and chili powder. °The items listed above may not be a complete list of foods and drinks to avoid. Contact your dietitian for more information.  °This information is not intended to replace advice given to you by your health care provider. Make sure you discuss any questions you have with your health care provider. °Document Released: 12/23/2011 Document Revised: 11/29/2015 Document Reviewed: 04/27/2013 °Elsevier Interactive Patient Education © 2017 Elsevier Inc. ° °

## 2016-10-16 ENCOUNTER — Inpatient Hospital Stay (HOSPITAL_COMMUNITY)
Admission: AD | Admit: 2016-10-16 | Discharge: 2016-10-16 | Disposition: A | Discharge status: Home / Self Care | Payer: BLUE CROSS/BLUE SHIELD | Source: Ambulatory Visit | Attending: Obstetrics and Gynecology | Admitting: Obstetrics and Gynecology

## 2016-10-16 ENCOUNTER — Encounter (HOSPITAL_COMMUNITY): Payer: Self-pay | Admitting: Anesthesiology

## 2016-10-16 ENCOUNTER — Encounter (HOSPITAL_COMMUNITY): Payer: Self-pay | Admitting: *Deleted

## 2016-10-16 DIAGNOSIS — E860 Dehydration: Secondary | ICD-10-CM

## 2016-10-16 DIAGNOSIS — O219 Vomiting of pregnancy, unspecified: Secondary | ICD-10-CM | POA: Diagnosis not present

## 2016-10-16 DIAGNOSIS — Z79899 Other long term (current) drug therapy: Secondary | ICD-10-CM | POA: Diagnosis not present

## 2016-10-16 DIAGNOSIS — O218 Other vomiting complicating pregnancy: Secondary | ICD-10-CM | POA: Diagnosis not present

## 2016-10-16 DIAGNOSIS — O26891 Other specified pregnancy related conditions, first trimester: Secondary | ICD-10-CM | POA: Diagnosis not present

## 2016-10-16 DIAGNOSIS — K117 Disturbances of salivary secretion: Secondary | ICD-10-CM | POA: Diagnosis not present

## 2016-10-16 DIAGNOSIS — Z3A09 9 weeks gestation of pregnancy: Secondary | ICD-10-CM | POA: Diagnosis not present

## 2016-10-16 LAB — CBC
HCT: 38.2 % (ref 36.0–46.0)
HEMOGLOBIN: 13 g/dL (ref 12.0–15.0)
MCH: 29 pg (ref 26.0–34.0)
MCHC: 34 g/dL (ref 30.0–36.0)
MCV: 85.1 fL (ref 78.0–100.0)
PLATELETS: 379 10*3/uL (ref 150–400)
RBC: 4.49 MIL/uL (ref 3.87–5.11)
RDW: 14.3 % (ref 11.5–15.5)
WBC: 7.7 10*3/uL (ref 4.0–10.5)

## 2016-10-16 LAB — COMPREHENSIVE METABOLIC PANEL
ALT: 15 U/L (ref 14–54)
AST: 37 U/L (ref 15–41)
Albumin: 4.1 g/dL (ref 3.5–5.0)
Alkaline Phosphatase: 60 U/L (ref 38–126)
Anion gap: 9 (ref 5–15)
BUN: 7 mg/dL (ref 6–20)
CHLORIDE: 103 mmol/L (ref 101–111)
CO2: 23 mmol/L (ref 22–32)
CREATININE: 0.5 mg/dL (ref 0.44–1.00)
Calcium: 9.4 mg/dL (ref 8.9–10.3)
GFR calc Af Amer: >60 mL/min (ref >60)
GFR calc non Af Amer: >60 mL/min (ref >60)
Glucose, Bld: 82 mg/dL (ref 65–99)
Potassium: 4.5 mmol/L (ref 3.5–5.1)
SODIUM: 135 mmol/L (ref 135–145)
Total Bilirubin: 1.4 mg/dL — ABNORMAL HIGH (ref 0.3–1.2)
Total Protein: 8.1 g/dL (ref 6.5–8.1)

## 2016-10-16 MED ORDER — LACTATED RINGERS IV BOLUS (SEPSIS)
1000.0000 mL | Freq: Once | INTRAVENOUS | Status: AC
  Administered 2016-10-16: 1000 mL via INTRAVENOUS

## 2016-10-16 MED ORDER — GLYCOPYRROLATE 0.2 MG/ML IJ SOLN
0.1000 mg | Freq: Once | INTRAMUSCULAR | Status: AC
  Administered 2016-10-16: 0.1 mg via INTRAVENOUS
  Filled 2016-10-16: qty 0.5

## 2016-10-16 MED ORDER — METOCLOPRAMIDE HCL 5 MG/ML IJ SOLN
10.0000 mg | Freq: Once | INTRAMUSCULAR | Status: AC
  Administered 2016-10-16: 10 mg via INTRAVENOUS
  Filled 2016-10-16: qty 2

## 2016-10-16 MED ORDER — LIDOCAINE HCL (PF) 1 % IJ SOLN
INTRAMUSCULAR | Status: AC
  Filled 2016-10-16: qty 30

## 2016-10-16 MED ORDER — M.V.I. ADULT IV INJ
Freq: Once | INTRAVENOUS | Status: AC
  Administered 2016-10-16: 19:00:00 via INTRAVENOUS
  Filled 2016-10-16: qty 10

## 2016-10-16 MED ORDER — FAMOTIDINE IN NACL 20-0.9 MG/50ML-% IV SOLN
20.0000 mg | Freq: Once | INTRAVENOUS | Status: AC
  Administered 2016-10-16: 20 mg via INTRAVENOUS
  Filled 2016-10-16: qty 50

## 2016-10-16 MED ORDER — GLYCOPYRROLATE 1 MG PO TABS: 1.0000 mg | ORAL_TABLET | Freq: Three times a day (TID) | ORAL | 0 refills | Status: DC

## 2016-10-16 MED ORDER — METOCLOPRAMIDE HCL 10 MG PO TABS: 10.0000 mg | ORAL_TABLET | Freq: Three times a day (TID) | ORAL | 0 refills | Status: DC | PRN

## 2016-10-16 NOTE — Discharge Instructions (Signed)

## 2016-10-16 NOTE — MAU Note (Signed)
Was here two days ago for vomiting and was given a prescription. Has been taking the medication and it has not improved. Having pain in her stomach and chest from the vomiting.

## 2016-10-16 NOTE — MAU Provider Note (Signed)
History     CSN: [id number]  Arrival date and time: 10/16/16 1515   First Provider Initiated Contact with Patient 10/16/16 1604      Chief Complaint  Patient presents with  . Emesis During Pregnancy   G4P3003  here with worsening N/V over the last 2 days. She is unable to tolerate anything by mouth. She also reports excessive salivation and spitting. She was seen 2 days ago and given Phenergan which has not helped. No fevers. No abdominal cramping or VB. Associated epigastric pain with emesis episodes.     OB History    Gravida Para Term Preterm AB Living   0 0 3   SAB TAB Ectopic Multiple Live Births   0 0 0 0 3      Past Medical History:  Diagnosis Date  . Back pain   . Medical history non-contributory     Past Surgical History:  Procedure Laterality Date  . CESAREAN SECTION    . CHOLECYSTECTOMY    . GALLBLADDER SURGERY  2007    History reviewed. No pertinent family history.  Social History  Substance Use Topics  . Smoking status: Never Smoker  . Smokeless tobacco: Never Used  . Alcohol use No    Allergies: No Known Allergies  Prescriptions Prior to Admission  Medication Sig Dispense Refill Last Dose  . acetaminophen (TYLENOL) 325 MG tablet Take 650 mg by mouth every 6 (six) hours as needed. headache   prn  . cetirizine (ZYRTEC) 10 MG tablet Take 10 mg by mouth daily as needed for allergies.    prn  . ibuprofen (ADVIL,MOTRIN) 200 MG tablet Take 400 mg by mouth every 6 (six) hours as needed for headache, moderate pain or cramping.    10/13/2016 at Unknown time  . Prenatal Vit-Fe Fumarate-FA (PRENATAL MULTIVITAMIN) TABS tablet Take 1 tablet by mouth at bedtime.   10/13/2016 at Unknown time  . promethazine (PHENERGAN) 12.5 MG tablet Take 1 tablet (12.5 mg total) by mouth every 6 (six) hours as needed for nausea or vomiting. 30 tablet 0   . ranitidine (ZANTAC) 150 MG tablet Take 1 tablet (150 mg total) by mouth 2 (two) times daily. 60 tablet 3      Review of Systems  Constitutional: Negative for fever.  Gastrointestinal: Positive for nausea and vomiting.  Genitourinary: Negative for vaginal bleeding.   Physical Exam   Blood pressure (!) 111/59, pulse 80, temperature 98.1 F (36.7 C), temperature source Oral, resp. rate 18, height  (1.702 m), weight 125.6 kg (277 lb), last menstrual period 08/13/2016, currently breastfeeding.  Physical Exam  Nursing note and vitals reviewed. Constitutional: She is oriented to person, place, and time. She appears well-developed and well-nourished. No distress.  HENT:  Head: Normocephalic and atraumatic.  Neck: Normal range of motion.  Cardiovascular: Normal rate.   Respiratory: Effort normal.  GI: Soft. She exhibits no distension. There is no tenderness.  Musculoskeletal: Normal range of motion.  Neurological: She is alert and oriented to person, place, and time.  Skin: Skin is warm and dry.  Psychiatric: She has a normal mood and affect.   Results for orders placed or performed during the hospital encounter of 10/16/16 (from the past 24 hour(s))  CBC     Status: None   Collection Time: 10/16/16  4:17 PM  Result Value Ref Range   WBC 7.7 4.0 - 10.5 K/uL   RBC 4.49 3.87 - 5.11 MIL/uL   Hemoglobin 13.0 12.0 - 15.0  g/dL   HCT 16.1 09.6 - 04.5 %   MCV 85.1 78.0 - 100.0 fL   MCH 29.0 26.0 - 34.0 pg   MCHC 34.0 30.0 - 36.0 g/dL   RDW 40.9 81.1 - 91.4 %   Platelets 379 150 - 400 K/uL  Comprehensive metabolic panel     Status: Abnormal   Collection Time: 10/16/16  4:17 PM  Result Value Ref Range   Sodium 135 135 - 145 mmol/L   Potassium 4.5 3.5 - 5.1 mmol/L   Chloride 103 101 - 111 mmol/L   CO2 23 22 - 32 mmol/L   Glucose, Bld 82 65 - 99 mg/dL   BUN 7 6 - 20 mg/dL   Creatinine, Ser 7.82 0.44 - 1.00 mg/dL   Calcium 9.4 8.9 - 95.6 mg/dL   Total Protein 8.1 6.5 - 8.1 g/dL   Albumin 4.1 3.5 - 5.0 g/dL   AST 37 15 - 41 U/L   ALT 15 14 - 54 U/L   Alkaline Phosphatase 60 38 - 126  U/L   Total Bilirubin 1.4 (H) 0.3 - 1.2 mg/dL   GFR calc non Af Amer >60 >60 mL/min   GFR calc Af Amer >60 >60 mL/min   Anion gap 9 5 - 15   MAU Course  Procedures LR 1 L MTV 1 L Pepcid Reglan Robinul  MDM Labs ordered and reviewed. Feeling much better. No emesis. Tolerating po. Stable for discharge home.  Assessment and Plan   1. [redacted] weeks gestation of pregnancy   2. Nausea/vomiting in pregnancy   3. Dehydration   4. Ptyalism    Discharge home Follow up at [facility] as scheduled next week Rx Robinul Rx Reglan Diclegis OTC regimen  Allergies as of 10/16/2016   No Known Allergies     Medication List    STOP taking these medications   ibuprofen 200 MG tablet Commonly known as:  ADVIL,MOTRIN   promethazine 12.5 MG tablet Commonly known as:  PHENERGAN     TAKE these medications   acetaminophen 325 MG tablet Commonly known as:  TYLENOL Take 650 mg by mouth every 6 (six) hours as needed. headache   cetirizine 10 MG tablet Commonly known as:  ZYRTEC Take 10 mg by mouth daily as needed for allergies.   glycopyrrolate 1 MG tablet Commonly known as:  ROBINUL Take 1 tablet (1 mg total) by mouth 3 (three) times daily.   metoCLOPramide 10 MG tablet Commonly known as:  REGLAN Take 1 tablet (10 mg total) by mouth every 8 (eight) hours as needed for nausea.   prenatal multivitamin Tabs tablet Take 1 tablet by mouth at bedtime.   ranitidine 150 MG tablet Commonly known as:  ZANTAC Take 1 tablet (150 mg total) by mouth 2 (two) times daily.      Donette Larry, CNM 10/16/2016, 4:12 PM

## 2016-10-16 NOTE — MAU Note (Signed)
Urine sent to lab 

## 2016-10-16 NOTE — MAU Note (Signed)
RN to the BS, pt. alert sipping on ginger ale. Pain 0/10, IV site clean dry intact and without s/s of infiltrate, currently bolusing multivitamins fluid bag with 500 ml left in a liter bag. RN will continue to monitor.

## 2016-10-22 ENCOUNTER — Encounter (HOSPITAL_COMMUNITY): Payer: Self-pay | Admitting: *Deleted

## 2016-10-22 ENCOUNTER — Inpatient Hospital Stay (HOSPITAL_COMMUNITY)
Admission: AD | Admit: 2016-10-22 | Discharge: 2016-10-22 | Disposition: A | Discharge status: Home / Self Care | Payer: BLUE CROSS/BLUE SHIELD | Source: Ambulatory Visit | Attending: Obstetrics & Gynecology | Admitting: Obstetrics & Gynecology

## 2016-10-22 DIAGNOSIS — O210 Mild hyperemesis gravidarum: Secondary | ICD-10-CM | POA: Diagnosis present

## 2016-10-22 DIAGNOSIS — O26891 Other specified pregnancy related conditions, first trimester: Secondary | ICD-10-CM

## 2016-10-22 DIAGNOSIS — O219 Vomiting of pregnancy, unspecified: Secondary | ICD-10-CM

## 2016-10-22 DIAGNOSIS — R51 Headache: Secondary | ICD-10-CM | POA: Diagnosis not present

## 2016-10-22 DIAGNOSIS — Z3A10 10 weeks gestation of pregnancy: Secondary | ICD-10-CM | POA: Diagnosis not present

## 2016-10-22 LAB — URINALYSIS, ROUTINE W REFLEX MICROSCOPIC
Bilirubin Urine: NEGATIVE
Glucose, UA: NEGATIVE mg/dL
Hgb urine dipstick: NEGATIVE
KETONES UR: 5 mg/dL — AB
Leukocytes, UA: NEGATIVE
Nitrite: NEGATIVE
PROTEIN: 30 mg/dL — AB
Specific Gravity, Urine: 1.027 (ref 1.005–1.030)
pH: 5 (ref 5.0–8.0)

## 2016-10-22 MED ORDER — METOCLOPRAMIDE HCL 10 MG PO TABS: 10.0000 mg | ORAL_TABLET | Freq: Three times a day (TID) | ORAL | 2 refills | Status: DC

## 2016-10-22 MED ORDER — METOCLOPRAMIDE HCL 5 MG/ML IJ SOLN
10.0000 mg | Freq: Once | INTRAMUSCULAR | Status: AC
  Administered 2016-10-22: 10 mg via INTRAVENOUS
  Filled 2016-10-22: qty 2

## 2016-10-22 MED ORDER — LACTATED RINGERS IV BOLUS (SEPSIS)
1000.0000 mL | Freq: Once | INTRAVENOUS | Status: AC
  Administered 2016-10-22: 1000 mL via INTRAVENOUS

## 2016-10-22 MED ORDER — DIPHENHYDRAMINE HCL 50 MG/ML IJ SOLN
25.0000 mg | Freq: Once | INTRAMUSCULAR | Status: AC
  Administered 2016-10-22: 25 mg via INTRAVENOUS
  Filled 2016-10-22: qty 1

## 2016-10-22 MED ORDER — DEXAMETHASONE SODIUM PHOSPHATE 10 MG/ML IJ SOLN
10.0000 mg | Freq: Once | INTRAMUSCULAR | Status: AC
  Administered 2016-10-22: 10 mg via INTRAVENOUS
  Filled 2016-10-22: qty 1

## 2016-10-22 NOTE — Discharge Instructions (Signed)
Nausea and Vomiting, Adult Feeling sick to your stomach (nausea) means that your stomach is upset or you feel like you have to throw up (vomit). Feeling more and more sick to your stomach can lead to throwing up. Throwing up happens when food and liquid from your stomach are thrown up and out the mouth. Throwing up can make you feel weak and cause you to get dehydrated. Dehydration can make you tired and thirsty, make you have a dry mouth, and make it so you pee (urinate) less often. Older adults and people with other diseases or a weak defense system (immune system) are at higher risk for dehydration. If you feel sick to your stomach or if you throw up, it is important to follow instructions from your doctor about how to take care of yourself. Follow these instructions at home: Eating and drinking  Follow these instructions as told by your doctor:  Take an oral rehydration solution (ORS). This is a drink that is sold at pharmacies and stores.  Drink clear fluids in small amounts as you are able, such as:  Water.  Ice chips.  Diluted fruit juice.  Low-calorie sports drinks.  Eat bland, easy-to-digest foods in small amounts as you are able, such as:  Bananas.  Applesauce.  Rice.  Low-fat (lean) meats.  Toast.  Crackers.  Avoid fluids that have a lot of sugar or caffeine in them.  Avoid alcohol.  Avoid spicy or fatty foods. General instructions   Drink enough fluid to keep your pee (urine) clear or pale yellow.  Wash your hands often. If you cannot use soap and water, use hand sanitizer.  Make sure that all people in your home wash their hands well and often.  Take over-the-counter and prescription medicines only as told by your doctor.  Rest at home while you get better.  Watch your condition for any changes.  Breathe slowly and deeply when you feel sick to your stomach.  Keep all follow-up visits as told by your doctor. This is important. Contact a doctor  if:  You have a fever.  You cannot keep fluids down.  Your symptoms get worse.  You have new symptoms.  You feel sick to your stomach for more than two days.  You feel light-headed or dizzy.  You have a headache.  You have muscle cramps. Get help right away if:  You have pain in your chest, neck, arm, or jaw.  You feel very weak or you pass out (faint).  You throw up again and again.  You see blood in your throw-up.  Your throw-up looks like black coffee grounds.  You have bloody or black poop (stools) or poop that look like tar.  You have a very bad headache, a stiff neck, or both.  You have a rash.  You have very bad pain, cramping, or bloating in your belly (abdomen).  You have trouble breathing.  You are breathing very quickly.  Your heart is beating very quickly.  Your skin feels cold and clammy.  You feel confused.  You have pain when you pee.  You have signs of dehydration, such as:  Dark pee, hardly any pee, or no pee.  Cracked lips.  Dry mouth.  Sunken eyes.  Sleepiness.  Weakness. These symptoms may be an emergency. Do not wait to see if the symptoms will go away. Get medical help right away. Call your local emergency services (911 in the [place] Do not drive yourself to the hospital. This information   is not intended to replace advice given to you by your health care provider. Make sure you discuss any questions you have with your health care provider. Document Released: 12/10/2007 Document Revised: 01/11/2016 Document Reviewed: 02/27/2015 Elsevier Interactive Patient Education  2017 Elsevier Inc.  

## 2016-10-22 NOTE — MAU Provider Note (Signed)
History     CSN: [id number]  Arrival date and time: 10/22/16 1504   First Provider Initiated Contact with Patient 10/22/16 1532      Chief Complaint  Patient presents with  . Headache  . Emesis During Pregnancy   HPI   Ms.[name] is a 31 y.o. female [phone] @ [redacted]w[redacted]d here in MAU with N/V and headache. She is vomiting everything she eats or drinks, even water. The headache is located on the left side of her head. The HA started Monday. She has tried taking tylenol, 2 extra strength every 4 hours. The tylenol did not help her HA. She had migraines with her first pregnancy. She has been taking Reglan for N/V.   OB History    Gravida Para Term Preterm AB Living   0 0 3   SAB TAB Ectopic Multiple Live Births   0 0 0 0 3      Past Medical History:  Diagnosis Date  . Back pain   . Medical history non-contributory     Past Surgical History:  Procedure Laterality Date  . CESAREAN SECTION    . CHOLECYSTECTOMY    . GALLBLADDER SURGERY  2007    History reviewed. No pertinent family history.  Social History  Substance Use Topics  . Smoking status: Never Smoker  . Smokeless tobacco: Never Used  . Alcohol use No    Allergies: No Known Allergies  Prescriptions Prior to Admission  Medication Sig Dispense Refill Last Dose  . acetaminophen (TYLENOL) 325 MG tablet Take 650 mg by mouth every 6 (six) hours as needed. headache   Past Week at Unknown time  . cetirizine (ZYRTEC) 10 MG tablet Take 10 mg by mouth daily as needed for allergies.    Past Month at Unknown time  . glycopyrrolate (ROBINUL) 1 MG tablet Take 1 tablet (1 mg total) by mouth 3 (three) times daily. 90 tablet 0   . metoCLOPramide (REGLAN) 10 MG tablet Take 1 tablet (10 mg total) by mouth every 8 (eight) hours as needed for nausea. 30 tablet 0   . Prenatal Vit-Fe Fumarate-FA (PRENATAL MULTIVITAMIN) TABS tablet Take 1 tablet by mouth at bedtime.   10/15/2016 at Unknown time  . ranitidine (ZANTAC) 150  MG tablet Take 1 tablet (150 mg total) by mouth 2 (two) times daily. 60 tablet 3 10/16/2016 at Unknown time   Results for orders placed or performed during the hospital encounter of 10/22/16 (from the past 24 hour(s))  Urinalysis, Routine w reflex microscopic     Status: Abnormal   Collection Time: 10/22/16  3:10 PM  Result Value Ref Range   Color, Urine AMBER (A) YELLOW   APPearance HAZY (A) CLEAR   Specific Gravity, Urine 1.027 1.005 - 1.030   pH 5.0 5.0 - 8.0   Glucose, UA NEGATIVE NEGATIVE mg/dL   Hgb urine dipstick NEGATIVE NEGATIVE   Bilirubin Urine NEGATIVE NEGATIVE   Ketones, ur 5 (A) NEGATIVE mg/dL   Protein, ur 30 (A) NEGATIVE mg/dL   Nitrite NEGATIVE NEGATIVE   Leukocytes, UA NEGATIVE NEGATIVE   RBC / HPF 0-5 0 - 5 RBC/hpf   WBC, UA 0-5 0 - 5 WBC/hpf   Bacteria, UA MANY (A) NONE SEEN   Squamous Epithelial / LPF 0-5 (A) NONE SEEN   Mucous PRESENT     Review of Systems  Eyes: Positive for photophobia.  Gastrointestinal: Positive for nausea and vomiting.   Physical Exam   Blood pressure 122/69, pulse  80, temperature 97.8 F (36.6 C), resp. rate 18, last menstrual period 08/13/2016, currently breastfeeding.  Physical Exam  Constitutional: She is oriented to person, place, and time. She appears well-developed and well-nourished. No distress.  HENT:  Head: Normocephalic.  Eyes: Pupils are equal, round, and reactive to light.  Musculoskeletal: Normal range of motion.  Neurological: She is alert and oriented to person, place, and time.  Skin: Skin is warm. She is not diaphoretic.  Psychiatric: Her behavior is normal.    MAU Course  Procedures  None  MDM  Urine culture pending LR bolus with decadron> reglan> benadryl Patient has called a ride home Patient rates her pain 0/10  Assessment and Plan   A:  1. Headache in pregnancy, antepartum, first trimester   2. Nausea and vomiting in pregnancy     P:  Discharge home in stable condition Rx: increase  Reglan to QID Ok to use tylenol as directed on the bottle Continue Zantac BID.  Small, frequent meals Increase PO fluid intake  Duane Lope, NP 10/22/2016 7:31 PM

## 2016-10-22 NOTE — MAU Note (Signed)
Pt presents to MAU with complaints of a headache for three days and nausea and vomiting related to  the pregnancy. Pt denies any vaginal bleeding or abnormal discharge

## 2016-10-24 LAB — CULTURE, OB URINE: SPECIAL REQUESTS: NORMAL

## 2016-10-27 ENCOUNTER — Other Ambulatory Visit (HOSPITAL_COMMUNITY): Payer: Self-pay | Admitting: Nurse Practitioner

## 2016-10-27 DIAGNOSIS — Z3682 Encounter for antenatal screening for nuchal translucency: Secondary | ICD-10-CM

## 2016-10-27 DIAGNOSIS — Z3A13 13 weeks gestation of pregnancy: Secondary | ICD-10-CM

## 2016-10-27 LAB — OB RESULTS CONSOLE ABO/RH: RH TYPE: POSITIVE

## 2016-10-27 LAB — OB RESULTS CONSOLE HEPATITIS B SURFACE ANTIGEN: Hepatitis B Surface Ag: NEGATIVE

## 2016-10-27 LAB — OB RESULTS CONSOLE ANTIBODY SCREEN: Antibody Screen: NEGATIVE

## 2016-10-27 LAB — OB RESULTS CONSOLE RPR: RPR: NONREACTIVE

## 2016-10-27 LAB — CYSTIC FIBROSIS DIAGNOSTIC STUDY: INTERPRETATION-CFDNA: NEGATIVE

## 2016-10-27 LAB — OB RESULTS CONSOLE RUBELLA ANTIBODY, IGM: Rubella: IMMUNE

## 2016-10-27 LAB — OB RESULTS CONSOLE HIV ANTIBODY (ROUTINE TESTING): HIV: NONREACTIVE

## 2016-10-27 LAB — OB RESULTS CONSOLE GC/CHLAMYDIA
CHLAMYDIA, DNA PROBE: NEGATIVE
GC PROBE AMP, GENITAL: NEGATIVE

## 2016-11-13 ENCOUNTER — Encounter (HOSPITAL_COMMUNITY): Payer: Self-pay

## 2016-11-13 ENCOUNTER — Ambulatory Visit (HOSPITAL_COMMUNITY)
Admission: RE | Admit: 2016-11-13 | Discharge: 2016-11-13 | Disposition: A | Discharge status: Home / Self Care | Payer: BLUE CROSS/BLUE SHIELD | Source: Ambulatory Visit | Attending: Nurse Practitioner | Admitting: Nurse Practitioner

## 2016-11-13 DIAGNOSIS — Z3A18 18 weeks gestation of pregnancy: Secondary | ICD-10-CM | POA: Diagnosis not present

## 2016-11-13 DIAGNOSIS — Z3682 Encounter for antenatal screening for nuchal translucency: Secondary | ICD-10-CM

## 2016-11-13 DIAGNOSIS — Z3A13 13 weeks gestation of pregnancy: Secondary | ICD-10-CM

## 2016-11-17 ENCOUNTER — Other Ambulatory Visit: Payer: Self-pay

## 2017-02-27 ENCOUNTER — Inpatient Hospital Stay (HOSPITAL_COMMUNITY)
Admission: AD | Admit: 2017-02-27 | Discharge: 2017-02-27 | Disposition: A | Discharge status: Home / Self Care | Payer: BLUE CROSS/BLUE SHIELD | Source: Ambulatory Visit | Attending: Family Medicine | Admitting: Family Medicine

## 2017-02-27 DIAGNOSIS — R197 Diarrhea, unspecified: Secondary | ICD-10-CM | POA: Insufficient documentation

## 2017-02-27 DIAGNOSIS — O9989 Other specified diseases and conditions complicating pregnancy, childbirth and the puerperium: Secondary | ICD-10-CM | POA: Diagnosis not present

## 2017-02-27 DIAGNOSIS — O99283 Endocrine, nutritional and metabolic diseases complicating pregnancy, third trimester: Secondary | ICD-10-CM | POA: Diagnosis not present

## 2017-02-27 DIAGNOSIS — E860 Dehydration: Secondary | ICD-10-CM | POA: Diagnosis not present

## 2017-02-27 DIAGNOSIS — R109 Unspecified abdominal pain: Secondary | ICD-10-CM | POA: Diagnosis present

## 2017-02-27 DIAGNOSIS — Z3A28 28 weeks gestation of pregnancy: Secondary | ICD-10-CM | POA: Diagnosis not present

## 2017-02-27 DIAGNOSIS — O212 Late vomiting of pregnancy: Secondary | ICD-10-CM | POA: Insufficient documentation

## 2017-02-27 DIAGNOSIS — O219 Vomiting of pregnancy, unspecified: Secondary | ICD-10-CM

## 2017-02-27 DIAGNOSIS — Z9049 Acquired absence of other specified parts of digestive tract: Secondary | ICD-10-CM | POA: Diagnosis not present

## 2017-02-27 DIAGNOSIS — A084 Viral intestinal infection, unspecified: Secondary | ICD-10-CM

## 2017-02-27 LAB — URINALYSIS, ROUTINE W REFLEX MICROSCOPIC
Bilirubin Urine: NEGATIVE
Glucose, UA: NEGATIVE mg/dL
Ketones, ur: 80 mg/dL — AB
Leukocytes, UA: NEGATIVE
NITRITE: NEGATIVE
PH: 5 (ref 5.0–8.0)
Protein, ur: 30 mg/dL — AB
Specific Gravity, Urine: 1.025 (ref 1.005–1.030)

## 2017-02-27 LAB — COMPREHENSIVE METABOLIC PANEL
ALK PHOS: 100 U/L (ref 38–126)
ALT: 22 U/L (ref 14–54)
ANION GAP: 12 (ref 5–15)
AST: 39 U/L (ref 15–41)
Albumin: 3.5 g/dL (ref 3.5–5.0)
BILIRUBIN TOTAL: 1.7 mg/dL — AB (ref 0.3–1.2)
BUN: 6 mg/dL (ref 6–20)
CALCIUM: 9.4 mg/dL (ref 8.9–10.3)
CO2: 18 mmol/L — ABNORMAL LOW (ref 22–32)
Chloride: 103 mmol/L (ref 101–111)
Creatinine, Ser: 0.34 mg/dL — ABNORMAL LOW (ref 0.44–1.00)
Glucose, Bld: 111 mg/dL — ABNORMAL HIGH (ref 65–99)
POTASSIUM: 3.6 mmol/L (ref 3.5–5.1)
Sodium: 133 mmol/L — ABNORMAL LOW (ref 135–145)
TOTAL PROTEIN: 7 g/dL (ref 6.5–8.1)

## 2017-02-27 LAB — CBC
HEMATOCRIT: 34.2 % — AB (ref 36.0–46.0)
HEMOGLOBIN: 11.8 g/dL — AB (ref 12.0–15.0)
MCH: 30.3 pg (ref 26.0–34.0)
MCHC: 34.5 g/dL (ref 30.0–36.0)
MCV: 87.7 fL (ref 78.0–100.0)
Platelets: 261 10*3/uL (ref 150–400)
RBC: 3.9 MIL/uL (ref 3.87–5.11)
RDW: 14.2 % (ref 11.5–15.5)
WBC: 9 10*3/uL (ref 4.0–10.5)

## 2017-02-27 MED ORDER — ONDANSETRON HCL 4 MG/2ML IJ SOLN
4.0000 mg | Freq: Once | INTRAMUSCULAR | Status: AC
  Administered 2017-02-27: 4 mg via INTRAVENOUS
  Filled 2017-02-27: qty 2

## 2017-02-27 MED ORDER — PROMETHAZINE HCL 25 MG PO TABS: 12.5000 mg | ORAL_TABLET | Freq: Four times a day (QID) | ORAL | 0 refills | Status: DC | PRN

## 2017-02-27 MED ORDER — LACTATED RINGERS IV BOLUS (SEPSIS)
1000.0000 mL | Freq: Once | INTRAVENOUS | Status: AC
  Administered 2017-02-27: 1000 mL via INTRAVENOUS

## 2017-02-27 MED ORDER — DEXTROSE 5 % IN LACTATED RINGERS IV BOLUS
1000.0000 mL | Freq: Once | INTRAVENOUS | Status: AC
  Administered 2017-02-27: 1000 mL via INTRAVENOUS

## 2017-02-27 MED ORDER — FAMOTIDINE IN NACL 20-0.9 MG/50ML-% IV SOLN
20.0000 mg | Freq: Once | INTRAVENOUS | Status: AC
  Administered 2017-02-27: 20 mg via INTRAVENOUS
  Filled 2017-02-27: qty 50

## 2017-02-27 MED ORDER — PROMETHAZINE HCL 25 MG/ML IJ SOLN
25.0000 mg | Freq: Four times a day (QID) | INTRAMUSCULAR | Status: DC | PRN
  Administered 2017-02-27: 25 mg via INTRAVENOUS
  Filled 2017-02-27: qty 1

## 2017-02-27 MED ORDER — ONDANSETRON 4 MG PO TBDP: 4.0000 mg | ORAL_TABLET | Freq: Four times a day (QID) | ORAL | 0 refills | Status: DC | PRN

## 2017-02-27 NOTE — MAU Note (Signed)
PO challenge, ginger ale given.

## 2017-02-27 NOTE — MAU Note (Signed)
Pt. States she is feeling better, pain 3/10.  Tolerated PO challenge well and without emesis.

## 2017-02-27 NOTE — MAU Provider Note (Signed)
History  Chief Complaint:  Abdominal Pain; Nausea (Vomit x4 in pass 24 hours); and Diarrhea (x6-7 in pass 24 hours)  [name] is a 31 y.o. [phone] female at [redacted]w[redacted]d presenting with nausea, vomiting and diarrhea. She reports 3-4 episodes of emesis in past 24h, and 6-7x of diarrhea since yesterday at 1700. She also endorses generalized abdominal pain and discomfort, rating the pain 8-9/10. She denies sharp stabbing pain. At some reheated white rice and fufu. Symptoms started right after finishing eating. Diarrhea started around 9pm about 4h after dinner. She reports feeling dizzy with some blurry vision and weakness. She has not been able to tolerate any PO intake. She denies fevers, chills, chest pain SOB, headaches.   She has hx of n/v of pregnancy, controlled with reglan. History of cholcystectomy. Hs of C-section for FTP.   Reports active fetal movement, contractions: none, vaginal bleeding: none, membranes: intact. Denies uti s/s, abnormal/malodorous vag d/c or vulvovaginal itching/irritation.   [facility] at [facility]. Pregnancy complicated by none.  Obstetrical History: OB History    Gravida Para Term Preterm AB Living   4 3 3  0 0 3   SAB TAB Ectopic Multiple Live Births   0 0 0 0 3      Past Medical History: Past Medical History:  Diagnosis Date  . Back pain   . Medical history non-contributory     Past Surgical History: Past Surgical History:  Procedure Laterality Date  . CESAREAN SECTION    . CHOLECYSTECTOMY    . GALLBLADDER SURGERY  2007    Social History: Social History   Social History  . Marital status: Married    Spouse name: N/A  . Number of children: N/A  . Years of education: N/A   Social History Main Topics  . Smoking status: Never Smoker  . Smokeless tobacco: Never Used  . Alcohol use No  . Drug use: No  . Sexual activity: Yes    Birth control/ protection: None   Other Topics Concern  . Not on file   Social History Narrative  .  No narrative on file    Allergies: No Known Allergies  No prescriptions prior to admission.    Review of Systems  Pertinent pos/neg as indicated in HPI  Physical Exam  Blood pressure 123/64, pulse (!) 111, temperature 98.3 F (36.8 C), temperature source Oral, resp. rate 20, last menstrual period 08/13/2016, SpO2 96 %, currently breastfeeding. General appearance: alert, cooperative and appears uncomfortable and tired Lungs: normal work of breathing Heart: regular rate Abdomen: gravid, soft, non-tender, no rebound or guarding, neg McBurney's, neg Murphy's  Extremities: no edema    Fetal monitoring: FHR: 140 bpm, variability: moderate,  Accelerations: Present,  decelerations:  Absent Uterine activity: no CTX  MAU Course  MDM: Vitals review, history and physical examination IV Fluid resuscitation IV phenergan and zofran Pepsid Patient verbalized agreement and understanding with the assessment and plan.  Labs:  Results for orders placed or performed during the hospital encounter of 02/27/17 (from the past 24 hour(s))  Urinalysis, Routine w reflex microscopic     Status: Abnormal   Collection Time: 02/27/17 12:15 PM  Result Value Ref Range   Color, Urine AMBER (A) YELLOW   APPearance CLOUDY (A) CLEAR   Specific Gravity, Urine 1.025 1.005 - 1.030   pH 5.0 5.0 - 8.0   Glucose, UA NEGATIVE NEGATIVE mg/dL   Hgb urine dipstick SMALL (A) NEGATIVE   Bilirubin Urine NEGATIVE NEGATIVE   Ketones, ur 80 (  A) NEGATIVE mg/dL   Protein, ur 30 (A) NEGATIVE mg/dL   Nitrite NEGATIVE NEGATIVE   Leukocytes, UA NEGATIVE NEGATIVE   RBC / HPF 0-5 0 - 5 RBC/hpf   WBC, UA 6-30 0 - 5 WBC/hpf   Bacteria, UA MANY (A) NONE SEEN   Squamous Epithelial / LPF 6-30 (A) NONE SEEN   Mucus PRESENT   CBC     Status: Abnormal   Collection Time: 02/27/17  2:05 PM  Result Value Ref Range   WBC 9.0 4.0 - 10.5 K/uL   RBC 3.90 3.87 - 5.11 MIL/uL   Hemoglobin 11.8 (L) 12.0 - 15.0 g/dL   HCT 16.1 (L) 09.6  - 46.0 %   MCV 87.7 78.0 - 100.0 fL   MCH 30.3 26.0 - 34.0 pg   MCHC 34.5 30.0 - 36.0 g/dL   RDW 04.5 40.9 - 81.1 %   Platelets 261 150 - 400 K/uL  Comprehensive metabolic panel     Status: Abnormal   Collection Time: 02/27/17  2:05 PM  Result Value Ref Range   Sodium 133 (L) 135 - 145 mmol/L   Potassium 3.6 3.5 - 5.1 mmol/L   Chloride 103 101 - 111 mmol/L   CO2 18 (L) 22 - 32 mmol/L   Glucose, Bld 111 (H) 65 - 99 mg/dL   BUN 6 6 - 20 mg/dL   Creatinine, Ser 9.14 (L) 0.44 - 1.00 mg/dL   Calcium 9.4 8.9 - 78.2 mg/dL   Total Protein 7.0 6.5 - 8.1 g/dL   Albumin 3.5 3.5 - 5.0 g/dL   AST 39 15 - 41 U/L   ALT 22 14 - 54 U/L   Alkaline Phosphatase 100 38 - 126 U/L   Total Bilirubin 1.7 (H) 0.3 - 1.2 mg/dL   GFR calc non Af Amer >60 >60 mL/min   GFR calc Af Amer >60 >60 mL/min   Anion gap 12 5 - 15   Imaging:  None Assessment and Plan  A: [name] is a 31yo G4P3 at [redacted]w[redacted]d who presents with nausea, vomiting and dehydration. History, physical exam and labwork suggestive of viral gastroenteritis causing moderate dehydration. Patient stated subjective improvement in symptoms and desire for discharge.  Cat 1 FHR.  P: Patient stable and appropriate for discharge.  Dehydration: Patient received IV D5 LRS for rehydration. Pt instructed to maintain adquate hydration status upon discharge. Return precautions given regarding new onset or worsening symptoms.   Nausea/Vomiting: Prescribed PO Zofran and phenergan for symptom management. Return precautions given regarding new onset or worsening symptoms.   Dannette Barbara, Medical Student 8/24/20187:02 PM   I confirm that I have verified the information documented in the medical student's note and that I have also personally performed the physical exam and all medical decision making activities.Pt abdominal pain resolved with IV fluids, nausea medications.  Preterm labor precautions reviewed.    Sharen Counter, CNM 8:48  PM

## 2017-02-27 NOTE — MAU Note (Signed)
Pt. Here for nausea and vomiting x 3-4 times in the last 24 hours, also diarrhea x6-7 in last 24 hours.  Symptoms started yesterday around 1700.  Generalized discomfort, pain 8-9/10.

## 2017-03-02 LAB — OB RESULTS CONSOLE RPR: RPR: NONREACTIVE

## 2017-03-02 LAB — OB RESULTS CONSOLE HIV ANTIBODY (ROUTINE TESTING): HIV: NONREACTIVE

## 2017-03-04 ENCOUNTER — Other Ambulatory Visit (HOSPITAL_COMMUNITY): Payer: Self-pay | Admitting: Nurse Practitioner

## 2017-03-04 DIAGNOSIS — Z3689 Encounter for other specified antenatal screening: Secondary | ICD-10-CM

## 2017-03-04 DIAGNOSIS — O24419 Gestational diabetes mellitus in pregnancy, unspecified control: Secondary | ICD-10-CM

## 2017-03-04 DIAGNOSIS — O26843 Uterine size-date discrepancy, third trimester: Secondary | ICD-10-CM

## 2017-03-05 ENCOUNTER — Ambulatory Visit (HOSPITAL_COMMUNITY)
Admission: RE | Admit: 2017-03-05 | Discharge: 2017-03-05 | Disposition: A | Discharge status: Home / Self Care | Payer: BLUE CROSS/BLUE SHIELD | Source: Ambulatory Visit | Attending: Nurse Practitioner | Admitting: Nurse Practitioner

## 2017-03-05 ENCOUNTER — Other Ambulatory Visit (HOSPITAL_COMMUNITY): Payer: Self-pay | Admitting: Nurse Practitioner

## 2017-03-05 ENCOUNTER — Encounter (HOSPITAL_COMMUNITY): Payer: Self-pay

## 2017-03-05 DIAGNOSIS — Z3A29 29 weeks gestation of pregnancy: Secondary | ICD-10-CM

## 2017-03-05 DIAGNOSIS — O34219 Maternal care for unspecified type scar from previous cesarean delivery: Secondary | ICD-10-CM

## 2017-03-05 DIAGNOSIS — Z3689 Encounter for other specified antenatal screening: Secondary | ICD-10-CM

## 2017-03-05 DIAGNOSIS — O0940 Supervision of pregnancy with grand multiparity, unspecified trimester: Secondary | ICD-10-CM | POA: Diagnosis not present

## 2017-03-05 DIAGNOSIS — O99213 Obesity complicating pregnancy, third trimester: Secondary | ICD-10-CM

## 2017-03-05 DIAGNOSIS — O26843 Uterine size-date discrepancy, third trimester: Secondary | ICD-10-CM

## 2017-03-05 DIAGNOSIS — Z6841 Body Mass Index (BMI) 40.0 and over, adult: Secondary | ICD-10-CM | POA: Diagnosis not present

## 2017-03-05 DIAGNOSIS — O24419 Gestational diabetes mellitus in pregnancy, unspecified control: Secondary | ICD-10-CM

## 2017-03-05 DIAGNOSIS — O24410 Gestational diabetes mellitus in pregnancy, diet controlled: Secondary | ICD-10-CM | POA: Diagnosis not present

## 2017-03-06 ENCOUNTER — Other Ambulatory Visit (HOSPITAL_COMMUNITY): Payer: Self-pay | Admitting: *Deleted

## 2017-03-06 DIAGNOSIS — O24410 Gestational diabetes mellitus in pregnancy, diet controlled: Secondary | ICD-10-CM

## 2017-03-06 DIAGNOSIS — O358XX0 Maternal care for other (suspected) fetal abnormality and damage, not applicable or unspecified: Secondary | ICD-10-CM

## 2017-03-12 ENCOUNTER — Encounter: Payer: BLUE CROSS/BLUE SHIELD | Attending: Family Medicine | Admitting: *Deleted

## 2017-03-12 ENCOUNTER — Other Ambulatory Visit (HOSPITAL_COMMUNITY): Payer: Self-pay | Admitting: Maternal and Fetal Medicine

## 2017-03-12 ENCOUNTER — Encounter (HOSPITAL_COMMUNITY): Payer: Self-pay

## 2017-03-12 ENCOUNTER — Other Ambulatory Visit: Payer: Self-pay

## 2017-03-12 ENCOUNTER — Ambulatory Visit (HOSPITAL_COMMUNITY)
Admission: RE | Admit: 2017-03-12 | Discharge: 2017-03-12 | Disposition: A | Discharge status: Home / Self Care | Payer: BLUE CROSS/BLUE SHIELD | Source: Ambulatory Visit | Attending: Nurse Practitioner | Admitting: Nurse Practitioner

## 2017-03-12 ENCOUNTER — Encounter: Payer: Self-pay | Admitting: *Deleted

## 2017-03-12 ENCOUNTER — Ambulatory Visit: Payer: BLUE CROSS/BLUE SHIELD | Admitting: *Deleted

## 2017-03-12 DIAGNOSIS — O24410 Gestational diabetes mellitus in pregnancy, diet controlled: Secondary | ICD-10-CM

## 2017-03-12 DIAGNOSIS — O24415 Gestational diabetes mellitus in pregnancy, controlled by oral hypoglycemic drugs: Secondary | ICD-10-CM | POA: Insufficient documentation

## 2017-03-12 DIAGNOSIS — O26843 Uterine size-date discrepancy, third trimester: Secondary | ICD-10-CM

## 2017-03-12 DIAGNOSIS — Z3A30 30 weeks gestation of pregnancy: Secondary | ICD-10-CM | POA: Diagnosis not present

## 2017-03-12 DIAGNOSIS — O34219 Maternal care for unspecified type scar from previous cesarean delivery: Secondary | ICD-10-CM

## 2017-03-12 DIAGNOSIS — Z3A00 Weeks of gestation of pregnancy not specified: Secondary | ICD-10-CM | POA: Diagnosis not present

## 2017-03-12 DIAGNOSIS — O24419 Gestational diabetes mellitus in pregnancy, unspecified control: Secondary | ICD-10-CM

## 2017-03-12 DIAGNOSIS — O358XX0 Maternal care for other (suspected) fetal abnormality and damage, not applicable or unspecified: Secondary | ICD-10-CM | POA: Diagnosis not present

## 2017-03-12 DIAGNOSIS — O99213 Obesity complicating pregnancy, third trimester: Secondary | ICD-10-CM

## 2017-03-12 MED ORDER — ACCU-CHEK GUIDE W/DEVICE KIT: 1.0000 | PACK | Freq: Once | 0 refills | Status: AC

## 2017-03-12 MED ORDER — ACCU-CHEK FASTCLIX LANCETS MISC: 1.0000 | Freq: Four times a day (QID) | 12 refills | Status: DC

## 2017-03-12 MED ORDER — GLUCOSE BLOOD VI STRP: ORAL_STRIP | 12 refills | Status: DC

## 2017-03-12 NOTE — Progress Notes (Signed)
  Patient was seen on 03/12/2017 for Gestational Diabetes self-management. EDD 05/20/2017. She states no history of GDM. She is home during the day, not working. Diet history obtained, typically eating 3 meals a day with good variety of all food groups. Primary beverage is water.  The following learning objectives were met by the patient :   States the definition of Gestational Diabetes  States why dietary management is important in controlling blood glucose  Describes the effects of carbohydrates on blood glucose levels  Demonstrates ability to create a balanced meal plan  Demonstrates carbohydrate counting   States when to check blood glucose levels  Demonstrates proper blood glucose monitoring techniques  States the effect of stress and exercise on blood glucose levels  States the importance of limiting caffeine and abstaining from alcohol and smoking  Plan:  Aim for 3 Carb Choices per meal (45 grams) +/- 1 either way  Aim for 1-2 Carbs per snack Begin reading food labels for Total Carbohydrate of foods Consider  increasing your activity level by walking or other activity daily as tolerated Begin checking BG before breakfast and 2 hours after first bite of breakfast, lunch and dinner as directed by MD  Bring Log Book/Sheet to every medical appointment   Take medication if directed by MD  Patient states she is applying for Pregnancy Medicaid, so will recommend Accu Chek Meter and supplies  Blood glucose monitor Rx called into pharmacy: Accu Check Guide with Fast Clix drums Patient instructed to test pre breakfast and 2 hours each meal as directed by MD  Patient instructed to monitor glucose levels: FBS: 60 - 95 mg/dl 2 hour: <120 mg/dl  Patient received the following handouts:  Nutrition Diabetes and Pregnancy  Carbohydrate Counting List  BG Log Sheet  Patient will be seen for follow-up as needed.

## 2017-03-16 ENCOUNTER — Encounter: Payer: Self-pay | Admitting: Lab

## 2017-03-16 ENCOUNTER — Ambulatory Visit (INDEPENDENT_AMBULATORY_CARE_PROVIDER_SITE_OTHER): Payer: BLUE CROSS/BLUE SHIELD | Admitting: Obstetrics and Gynecology

## 2017-03-16 ENCOUNTER — Encounter: Payer: Self-pay | Admitting: Obstetrics and Gynecology

## 2017-03-16 DIAGNOSIS — O0993 Supervision of high risk pregnancy, unspecified, third trimester: Secondary | ICD-10-CM | POA: Insufficient documentation

## 2017-03-16 DIAGNOSIS — O24410 Gestational diabetes mellitus in pregnancy, diet controlled: Secondary | ICD-10-CM

## 2017-03-16 DIAGNOSIS — Z3483 Encounter for supervision of other normal pregnancy, third trimester: Secondary | ICD-10-CM

## 2017-03-16 DIAGNOSIS — O34219 Maternal care for unspecified type scar from previous cesarean delivery: Secondary | ICD-10-CM

## 2017-03-16 DIAGNOSIS — Z98891 History of uterine scar from previous surgery: Secondary | ICD-10-CM | POA: Insufficient documentation

## 2017-03-16 DIAGNOSIS — Z8632 Personal history of gestational diabetes: Secondary | ICD-10-CM | POA: Insufficient documentation

## 2017-03-16 NOTE — Patient Instructions (Addendum)
Carbohydrate Counting for Diabetes Mellitus, Adult Carbohydrate counting is a method for keeping track of how many carbohydrates you eat. Eating carbohydrates naturally increases the amount of sugar (glucose) in the blood. Counting how many carbohydrates you eat helps keep your blood glucose within normal limits, which helps you manage your diabetes (diabetes mellitus). It is important to know how many carbohydrates you can safely have in each meal. This is different for every person. A diet and nutrition specialist (registered dietitian) can help you make a meal plan and calculate how many carbohydrates you should have at each meal and snack. Carbohydrates are found in the following foods:  Grains, such as breads and cereals.  Dried beans and soy products.  Starchy vegetables, such as potatoes, peas, and corn.  Fruit and fruit juices.  Milk and yogurt.  Sweets and snack foods, such as cake, cookies, candy, chips, and soft drinks.  How do I count carbohydrates? There are two ways to count carbohydrates in food. You can use either of the methods or a combination of both. Reading "Nutrition Facts" on packaged food The "Nutrition Facts" list is included on the labels of almost all packaged foods and beverages in the [place] It includes:  The serving size.  Information about nutrients in each serving, including the grams (g) of carbohydrate per serving.  To use the "Nutrition Facts":  Decide how many servings you will have.  Multiply the number of servings by the number of carbohydrates per serving.  The resulting number is the total amount of carbohydrates that you will be having.  Learning standard serving sizes of other foods When you eat foods containing carbohydrates that are not packaged or do not include "Nutrition Facts" on the label, you need to measure the servings in order to count the amount of carbohydrates:  Measure the foods that you will eat with a food scale or  measuring cup, if needed.  Decide how many standard-size servings you will eat.  Multiply the number of servings by 15. Most carbohydrate-rich foods have about 15 g of carbohydrates per serving. ? For example, if you eat 8 oz (170 g) of strawberries, you will have eaten 2 servings and 30 g of carbohydrates (2 servings x 15 g = 30 g).  For foods that have more than one food mixed, such as soups and casseroles, you must count the carbohydrates in each food that is included.  The following list contains standard serving sizes of common carbohydrate-rich foods. Each of these servings has about 15 g of carbohydrates:   hamburger bun or  English muffin.   oz (15 mL) syrup.   oz (14 g) jelly.  1 slice of bread.  1 six-inch tortilla.  3 oz (85 g) cooked rice or pasta.  4 oz (113 g) cooked dried beans.  4 oz (113 g) starchy vegetable, such as peas, corn, or potatoes.  4 oz (113 g) hot cereal.  4 oz (113 g) mashed potatoes or  of a large baked potato.  4 oz (113 g) canned or frozen fruit.  4 oz (120 mL) fruit juice.  4-6 crackers.  6 chicken nuggets.  6 oz (170 g) unsweetened dry cereal.  6 oz (170 g) plain fat-free yogurt or yogurt sweetened with artificial sweeteners.  8 oz (240 mL) milk.  8 oz (170 g) fresh fruit or one small piece of fruit.  24 oz (680 g) popped popcorn.  Example of carbohydrate counting Sample meal  3 oz (85 g) chicken breast.    6 oz (170 g) brown rice.  4 oz (113 g) corn.  8 oz (240 mL) milk.  8 oz (170 g) strawberries with sugar-free whipped topping. Carbohydrate calculation 1. Identify the foods that contain carbohydrates: ? Rice. ? Corn. ? Milk. ? Strawberries. 2. Calculate how many servings you have of each food: ? 2 servings rice. ? 1 serving corn. ? 1 serving milk. ? 1 serving strawberries. 3. Multiply each number of servings by 15 g: ? 2 servings rice x 15 g = 30 g. ? 1 serving corn x 15 g = 15 g. ? 1 serving milk x 15  g = 15 g. ? 1 serving strawberries x 15 g = 15 g. 4. Add together all of the amounts to find the total grams of carbohydrates eaten: ? 30 g + 15 g + 15 g + 15 g = 75 g of carbohydrates total. This information is not intended to replace advice given to you by your health care provider. Make sure you discuss any questions you have with your health care provider. Document Released: 06/23/2005 Document Revised: 01/11/2016 Document Reviewed: 12/05/2015 Elsevier Interactive Patient Education  2018 [employer]. Gestational Diabetes Mellitus, Diagnosis Gestational diabetes (gestational diabetes mellitus) is a temporary form of diabetes that some women develop during pregnancy. It usually occurs around weeks 24-28 of pregnancy and goes away after delivery. Hormonal changes during pregnancy can interfere with insulin production and function, which may result in one or both of these problems:  The pancreas does not make enough of a hormone called insulin.  Cells in the body do not respond properly to insulin that the body makes (insulin resistance).  Normally, insulin allows sugars (glucose) to enter cells in the body. The cells use glucose for energy. Insulin resistance or lack of insulin causes excess glucose to build up in the blood instead of going into cells. As a result, high blood glucose (hyperglycemia) develops. What are the risks? If gestational diabetes is treated, it is unlikely to cause problems. If it is not controlled with treatment, it may cause problems during labor and delivery, and some of those problems can be harmful to the unborn baby (fetus) and the mother. Uncontrolled gestational diabetes may also cause the newborn baby to have breathing problems and low blood glucose. Women who get gestational diabetes are more likely to develop it if they get pregnant again, and they are more likely to develop type 2 diabetes in the future. What increases the risk? This condition may be more  likely to develop in pregnant women who:  Are older than age 34 during pregnancy.  Have a family history of diabetes.  Are overweight.  Had gestational diabetes in the past.  Have polycystic ovarian syndrome (PCOS).  Are pregnant with twins or multiples.  Are of American-Indian, African-American, Hispanic/Latino, or Asian/Pacific Islander descent.  What are the signs or symptoms? Most women do not notice symptoms of gestational diabetes because the symptoms are similar to normal symptoms of pregnancy. Symptoms of gestational diabetes may include:  Increased thirst (polydipsia).  Increased hunger(polyphagia).  Increased urination (polyuria).  How is this diagnosed?  This condition may be diagnosed based on your blood glucose level, which may be checked with one or more of the following blood tests:  A fasting blood glucose (FBG) test. You will not be allowed to eat (you will fast) for at least 8 hours before a blood sample is taken.  A random blood glucose test. This checks your blood glucose at  any time of day regardless of when you ate.  An oral glucose tolerance test (OGTT). This is usually done during weeks 24-28 of pregnancy. ? For this test, you will have an FBG test done. Then, you will drink a beverage that contains glucose. Your blood glucose will be tested again 1 hour after drinking the glucose beverage (1-hour OGTT). ? If the 1-hour OGTT result is at or above 140 mg/dL (7.8 mmol/L), you will repeat the OGTT. This time, your blood glucose will be tested 3 hours after drinking the glucose beverage (3-hour OGTT).  If you have risk factors, you may be screened for undiagnosed type 2 diabetes at your first health care visit during your pregnancy (prenatal visit). How is this treated?  Your treatment may be managed by a specialist called an endocrinologist. This condition is treated by following instructions from your health care provider about:  Eating a healthier  diet and getting more physical activity. These changes are the most important ways to manage gestational diabetes.  Checking your blood glucose. Do this as often as told.  Taking diabetes medicines or insulin every day. These will only be prescribed if they are needed. ? If you use insulin, you may need to adjust your dosage based on how physically active you are and what foods you eat. Your health care provider will tell you how to do this.  Your health care provider will set treatment goals for you based on the stage of your pregnancy and any other medical conditions you have. Generally, the goal of treatment is to maintain the following blood glucose levels during pregnancy:  Fasting: at or below 95 mg/dL (5.3 mmol/L).  After meals (postprandial): ? One hour after a meal: at or below 140 mg/dL (7.8 mmol/L). ? Two hours after a meal: at or below 120 mg/dL (6.7 mmol/L).  A1c (hemoglobin A1c) level: 6-6.5%.  Follow these instructions at home:  Take over-the-counter and prescription medicines only as told by your health care provider.  Manage your weight gain during pregnancy. The amount of weight that you are expected to gain depends on your pre-pregnancy BMI (body mass index).  Keep all follow-up visits as told by your health care provider. This is important. Consider asking your health care provider these questions:   Do I need to meet with a diabetes educator?  Where can I find a support group for people with diabetes?  What equipment will I need to manage my diabetes at home?  What diabetes medicines do I need, and when should I take them?  How often do I need to check my blood glucose?  What number can I call if I have questions?  When is my next appointment? Where to find more information:  For more information about diabetes, visit: ? American Diabetes Association (ADA): www.diabetes.org ? American Association of Diabetes Educators (AADE):  www.diabeteseducator.org/patient-resources Contact a health care provider if:  Your blood glucose level is at or above 240 mg/dL (16.1 mmol/L).  Your blood glucose level is at or above 200 mg/dL (09.6 mmol/L) and you have ketones in your urine.  You have been sick or have had a fever for 2 days or more and you are not getting better.  You have any of the following problems for more than 6 hours: ? You cannot eat or drink. ? You have nausea and vomiting. ? You have diarrhea. Get help right away if:  Your blood glucose is below 54 mg/dL (3 mmol/L).  You become confused  or you have trouble thinking clearly.  You have difficulty breathing.  You have moderate or large ketone levels in your urine.  Your baby is moving around less than usual.  You develop unusual discharge or bleeding from your vagina.  You start having contractions early (prematurely). Contractions may feel like a tightening in your lower abdomen. This information is not intended to replace advice given to you by your health care provider. Make sure you discuss any questions you have with your health care provider. Document Released: 09/29/2000 Document Revised: 11/29/2015 Document Reviewed: 07/27/2015 Elsevier Interactive Patient Education  2017 [employer]

## 2017-03-16 NOTE — Progress Notes (Signed)
Subjective:  [name] is a 31 y.o. G4P3003 at 3238w5d being seen today for ongoing prenatal care. [facility]. Pt with unremarkable PNC til this point. H/O c section x 1. H/O successful VBAC x2. She has seen DM education, just started checking BS over the weekend. Not in goal range yet. Also has URI Sx. Plans on IUD and to breast feed.  She is currently monitored for the following issues for this high-risk pregnancy and has Supervision of normal IUP (intrauterine pregnancy) in multigravida, third trimester; Gestational diabetes; and History of cesarean section on her problem list.  Patient reports URI Sx.   .  .  Movement: Present. Denies leaking of fluid.   The following portions of the patient's history were reviewed and updated as appropriate: allergies, current medications, past family history, past medical history, past social history, past surgical history and problem list. Problem list updated.  Objective:   Vitals:   03/16/17 1307  BP: 115/72  Pulse: (!) 106  Weight: 130.6 kg (288 lb)    Fetal Status: Fetal Heart Rate (bpm): 144   Movement: Present     General:  Alert, oriented and cooperative. Patient is in no acute distress.  Skin: Skin is warm and dry. No rash noted.   Cardiovascular: Normal heart rate noted  Respiratory: Normal respiratory effort, no problems with respiration noted  Abdomen: Soft, gravid, appropriate for gestational age. Pain/Pressure: Absent     Pelvic:  Cervical exam deferred        Extremities: Normal range of motion.     Mental Status: Normal mood and affect. Normal behavior. Normal judgment and thought content.   Urinalysis:      Assessment and Plan:  Pregnancy: G4P3003 at 4738w5d  1. Supervision of normal IUP (intrauterine pregnancy) in multigravida, third trimester Stable URI Tx reviewed with pt Flu vaccine and Tdap vaccine at next OB visit  2. Diet controlled gestational diabetes mellitus (GDM) in third trimester GDM  and pregnancy reviewed with pt. Pt already receiving weekly BPP, will continue Will have pt return next week to observe BS and determine in medication needed  3. History of cesarean section TOLAC form today  Preterm labor symptoms and general obstetric precautions including but not limited to vaginal bleeding, contractions, leaking of fluid and fetal movement were reviewed in detail with the patient. Please refer to After Visit Summary for other counseling recommendations.  Return in about 1 week (around 03/23/2017) for OB visit.   Hermina StaggersErvin, Hailee Hollick L, MD

## 2017-03-16 NOTE — Progress Notes (Signed)
Discuss blood sugars reading

## 2017-03-16 NOTE — Progress Notes (Signed)
Breast feeding education completed New ob packet given  Patient has congestion

## 2017-03-19 ENCOUNTER — Ambulatory Visit (HOSPITAL_COMMUNITY)
Admission: RE | Admit: 2017-03-19 | Discharge: 2017-03-19 | Disposition: A | Discharge status: Home / Self Care | Payer: BLUE CROSS/BLUE SHIELD | Source: Ambulatory Visit | Attending: Nurse Practitioner | Admitting: Nurse Practitioner

## 2017-03-19 ENCOUNTER — Encounter (HOSPITAL_COMMUNITY): Payer: Self-pay

## 2017-03-19 DIAGNOSIS — O26843 Uterine size-date discrepancy, third trimester: Secondary | ICD-10-CM | POA: Insufficient documentation

## 2017-03-19 DIAGNOSIS — O24410 Gestational diabetes mellitus in pregnancy, diet controlled: Secondary | ICD-10-CM | POA: Diagnosis not present

## 2017-03-19 DIAGNOSIS — Z3A31 31 weeks gestation of pregnancy: Secondary | ICD-10-CM | POA: Diagnosis not present

## 2017-03-19 DIAGNOSIS — O358XX0 Maternal care for other (suspected) fetal abnormality and damage, not applicable or unspecified: Secondary | ICD-10-CM | POA: Diagnosis not present

## 2017-03-19 DIAGNOSIS — O34219 Maternal care for unspecified type scar from previous cesarean delivery: Secondary | ICD-10-CM | POA: Diagnosis not present

## 2017-03-19 DIAGNOSIS — O99213 Obesity complicating pregnancy, third trimester: Secondary | ICD-10-CM | POA: Insufficient documentation

## 2017-03-23 ENCOUNTER — Ambulatory Visit (INDEPENDENT_AMBULATORY_CARE_PROVIDER_SITE_OTHER): Payer: BLUE CROSS/BLUE SHIELD | Admitting: Obstetrics & Gynecology

## 2017-03-23 ENCOUNTER — Encounter: Payer: Self-pay | Admitting: Obstetrics & Gynecology

## 2017-03-23 VITALS — BP 104/55 | HR 107 | Wt 286.0 lb

## 2017-03-23 DIAGNOSIS — O24415 Gestational diabetes mellitus in pregnancy, controlled by oral hypoglycemic drugs: Secondary | ICD-10-CM

## 2017-03-23 DIAGNOSIS — Z3483 Encounter for supervision of other normal pregnancy, third trimester: Secondary | ICD-10-CM

## 2017-03-23 MED ORDER — GLYBURIDE 2.5 MG PO TABS: 2.5000 mg | ORAL_TABLET | Freq: Two times a day (BID) | ORAL | 3 refills | Status: DC

## 2017-03-23 NOTE — Progress Notes (Signed)
   PRENATAL VISIT NOTE  Subjective:  [name] is a 31 y.o. [id number] at [redacted]w[redacted]d being seen today for ongoing prenatal care.  She is currently monitored for the following issues for this high-risk pregnancy and has Supervision of normal IUP (intrauterine pregnancy) in multigravida, third trimester; Gestational diabetes; and History of cesarean section on her problem list.  Patient reports no complaints.   .  .  Movement: Present. Denies leaking of fluid.   The following portions of the patient's history were reviewed and updated as appropriate: allergies, current medications, past family history, past medical history, past social history, past surgical history and problem list. Problem list updated.  Objective:   Vitals:   03/23/17 0908  BP: (!) 104/55  Pulse: (!) 107  Weight: 286 lb (129.7 kg)    Fetal Status: Fetal Heart Rate (bpm): 147   Movement: Present     General:  Alert, oriented and cooperative. Patient is in no acute distress.  Skin: Skin is warm and dry. No rash noted.   Cardiovascular: Normal heart rate noted  Respiratory: Normal respiratory effort, no problems with respiration noted  Abdomen: Soft, gravid, appropriate for gestational age.  Pain/Pressure: Absent     Pelvic: Cervical exam deferred        Extremities: Normal range of motion.     Mental Status:  Normal mood and affect. Normal behavior. Normal judgment and thought content.   Assessment and Plan:  Pregnancy: G4P3003 at [redacted]w[redacted]d  1. Gestational diabetes mellitus (GDM) in third trimester controlled on oral hypoglycemic drug FBS 110-130 and PP 120-170 - glyBURIDE (DIABETA) 2.5 MG tablet; Take 1 tablet (2.5 mg total) by mouth 2 (two) times daily with a meal.  Dispense: 60 tablet; Refill: 3  2. Supervision of normal IUP (intrauterine pregnancy) in multigravida, third trimester BPP last week was 8/8 and she is scheduled for repeat in 3 days  Preterm labor symptoms and general obstetric precautions including  but not limited to vaginal bleeding, contractions, leaking of fluid and fetal movement were reviewed in detail with the patient. Please refer to After Visit Summary for other counseling recommendations.  Return in about 1 week (around 03/30/2017).   Scheryl Darter, MD

## 2017-03-23 NOTE — Patient Instructions (Signed)

## 2017-03-26 ENCOUNTER — Other Ambulatory Visit (HOSPITAL_COMMUNITY): Payer: Self-pay | Admitting: Maternal and Fetal Medicine

## 2017-03-26 ENCOUNTER — Encounter (HOSPITAL_COMMUNITY): Payer: Self-pay

## 2017-03-26 ENCOUNTER — Ambulatory Visit (HOSPITAL_COMMUNITY)
Admission: RE | Admit: 2017-03-26 | Discharge: 2017-03-26 | Disposition: A | Discharge status: Home / Self Care | Payer: BLUE CROSS/BLUE SHIELD | Source: Ambulatory Visit | Attending: Nurse Practitioner | Admitting: Nurse Practitioner

## 2017-03-26 DIAGNOSIS — O24415 Gestational diabetes mellitus in pregnancy, controlled by oral hypoglycemic drugs: Secondary | ICD-10-CM

## 2017-03-26 DIAGNOSIS — O358XX0 Maternal care for other (suspected) fetal abnormality and damage, not applicable or unspecified: Secondary | ICD-10-CM | POA: Diagnosis present

## 2017-03-26 DIAGNOSIS — Z3A32 32 weeks gestation of pregnancy: Secondary | ICD-10-CM

## 2017-03-30 ENCOUNTER — Other Ambulatory Visit (HOSPITAL_COMMUNITY): Payer: Self-pay | Admitting: Maternal and Fetal Medicine

## 2017-03-30 ENCOUNTER — Ambulatory Visit (HOSPITAL_COMMUNITY)
Admission: RE | Admit: 2017-03-30 | Discharge: 2017-03-30 | Disposition: A | Discharge status: Home / Self Care | Payer: BLUE CROSS/BLUE SHIELD | Source: Ambulatory Visit | Attending: Nurse Practitioner | Admitting: Nurse Practitioner

## 2017-03-30 ENCOUNTER — Encounter (HOSPITAL_COMMUNITY): Payer: Self-pay

## 2017-03-30 DIAGNOSIS — O358XX0 Maternal care for other (suspected) fetal abnormality and damage, not applicable or unspecified: Secondary | ICD-10-CM

## 2017-03-30 DIAGNOSIS — O24410 Gestational diabetes mellitus in pregnancy, diet controlled: Secondary | ICD-10-CM

## 2017-03-30 DIAGNOSIS — O34211 Maternal care for low transverse scar from previous cesarean delivery: Secondary | ICD-10-CM | POA: Diagnosis not present

## 2017-03-30 DIAGNOSIS — Z3A32 32 weeks gestation of pregnancy: Secondary | ICD-10-CM

## 2017-03-30 DIAGNOSIS — O99213 Obesity complicating pregnancy, third trimester: Secondary | ICD-10-CM | POA: Diagnosis not present

## 2017-03-30 DIAGNOSIS — O24415 Gestational diabetes mellitus in pregnancy, controlled by oral hypoglycemic drugs: Secondary | ICD-10-CM

## 2017-04-01 ENCOUNTER — Encounter: Payer: BLUE CROSS/BLUE SHIELD | Admitting: Advanced Practice Midwife

## 2017-04-02 ENCOUNTER — Ambulatory Visit (HOSPITAL_COMMUNITY)
Admission: RE | Admit: 2017-04-02 | Discharge: 2017-04-02 | Disposition: A | Discharge status: Home / Self Care | Payer: BLUE CROSS/BLUE SHIELD | Source: Ambulatory Visit | Attending: Nurse Practitioner | Admitting: Nurse Practitioner

## 2017-04-02 ENCOUNTER — Inpatient Hospital Stay (HOSPITAL_COMMUNITY)
Admission: AD | Admit: 2017-04-02 | Discharge: 2017-04-02 | Disposition: A | Discharge status: Home / Self Care | Payer: BLUE CROSS/BLUE SHIELD | Source: Ambulatory Visit | Attending: Obstetrics & Gynecology | Admitting: Obstetrics & Gynecology

## 2017-04-02 ENCOUNTER — Other Ambulatory Visit (HOSPITAL_COMMUNITY): Payer: Self-pay | Admitting: Maternal and Fetal Medicine

## 2017-04-02 ENCOUNTER — Encounter (HOSPITAL_COMMUNITY): Payer: Self-pay | Admitting: *Deleted

## 2017-04-02 ENCOUNTER — Encounter (HOSPITAL_COMMUNITY): Payer: Self-pay

## 2017-04-02 ENCOUNTER — Inpatient Hospital Stay (HOSPITAL_COMMUNITY): Payer: BLUE CROSS/BLUE SHIELD

## 2017-04-02 ENCOUNTER — Other Ambulatory Visit (HOSPITAL_COMMUNITY): Payer: Self-pay | Admitting: *Deleted

## 2017-04-02 DIAGNOSIS — Z3A33 33 weeks gestation of pregnancy: Secondary | ICD-10-CM

## 2017-04-02 DIAGNOSIS — O34211 Maternal care for low transverse scar from previous cesarean delivery: Secondary | ICD-10-CM

## 2017-04-02 DIAGNOSIS — Z3689 Encounter for other specified antenatal screening: Secondary | ICD-10-CM

## 2017-04-02 DIAGNOSIS — O34219 Maternal care for unspecified type scar from previous cesarean delivery: Secondary | ICD-10-CM

## 2017-04-02 DIAGNOSIS — IMO0002 Reserved for concepts with insufficient information to code with codable children: Secondary | ICD-10-CM

## 2017-04-02 DIAGNOSIS — O358XX0 Maternal care for other (suspected) fetal abnormality and damage, not applicable or unspecified: Secondary | ICD-10-CM

## 2017-04-02 DIAGNOSIS — O24415 Gestational diabetes mellitus in pregnancy, controlled by oral hypoglycemic drugs: Secondary | ICD-10-CM

## 2017-04-02 DIAGNOSIS — O24410 Gestational diabetes mellitus in pregnancy, diet controlled: Secondary | ICD-10-CM

## 2017-04-02 DIAGNOSIS — O99213 Obesity complicating pregnancy, third trimester: Secondary | ICD-10-CM

## 2017-04-02 DIAGNOSIS — O99210 Obesity complicating pregnancy, unspecified trimester: Secondary | ICD-10-CM

## 2017-04-02 DIAGNOSIS — Z362 Encounter for other antenatal screening follow-up: Secondary | ICD-10-CM

## 2017-04-02 HISTORY — DX: Gestational diabetes mellitus in pregnancy, unspecified control: O24.419

## 2017-04-02 NOTE — MAU Note (Signed)
Pt sent to MAU from MFM for EFM secondary non reactive NST during office vist.

## 2017-04-02 NOTE — MAU Note (Signed)
Pt sent from MFM for monitoring, NR NST today

## 2017-04-02 NOTE — Procedures (Signed)
[name] 09/28/1985 [redacted]w[redacted]d  Fetus A Non-Stress Test Interpretation for 04/02/17  Indication: Unsatisfactory BPP  Fetal Heart Rate A Mode: External Baseline Rate (A): 130 bpm Variability: Minimal, Moderate Accelerations: 10 x 10 Decelerations: None Multiple birth?: No  Uterine Activity Mode: Palpation, Toco Contraction Frequency (min): none  Interpretation (Fetal Testing) Nonstress Test Interpretation: Non-reactive Comments: Reviewed tracing with Dr. Vincenza Hews

## 2017-04-02 NOTE — MAU Provider Note (Signed)
History     CSN: [id number]  Arrival date and time: 04/02/17 1606  First Provider Initiated Contact with Patient 04/02/17 1706      Chief Complaint  Patient presents with  . Non-stress Test   HPI Shakia Sebastiano is a 31 y.o. [id number] at [redacted]w[redacted]d who presents from MFM for fetal monitoring. Had NST today d/t GDM. BPP 6/8 (off for breathing) & NRNST. Recommendation from MFM for prolonged monitoring & repeat BPP in 6 hours. Patient denies abdominal pain, LOF, or vaginal bleeding. Positive fetal movement.   OB History    Gravida Para Term Preterm AB Living   0 0 3   SAB TAB Ectopic Multiple Live Births   0 0 0 0 3      Past Medical History:  Diagnosis Date  . Back pain   . Gestational diabetes    Meds & diet controlled  . Medical history non-contributory     Past Surgical History:  Procedure Laterality Date  . CESAREAN SECTION    . CHOLECYSTECTOMY    . GALLBLADDER SURGERY  2007    History reviewed. No pertinent family history.  Social History  Substance Use Topics  . Smoking status: Never Smoker  . Smokeless tobacco: Never Used  . Alcohol use No    Allergies: No Known Allergies  Prescriptions Prior to Admission  Medication Sig Dispense Refill Last Dose  . ACCU-CHEK FASTCLIX LANCETS MISC 1 Device by Percutaneous route 4 (four) times daily. 100 each 12 Taking  . acetaminophen (TYLENOL) 325 MG tablet Take 650 mg by mouth every 6 (six) hours as needed. headache   Taking  . cetirizine (ZYRTEC) 10 MG tablet Take 10 mg by mouth daily as needed for allergies.    Taking  . glucose blood (ACCU-CHEK GUIDE) test strip Use as instructed four times a day 100 each 12 Taking  . glyBURIDE (DIABETA) 2.5 MG tablet Take 1 tablet (2.5 mg total) by mouth 2 (two) times daily with a meal. 60 tablet 3 Taking  . metoCLOPramide (REGLAN) 10 MG tablet Take 1 tablet (10 mg total) by mouth 4 (four) times daily -  before meals and at bedtime. 30 tablet 2 Taking  . ondansetron (ZOFRAN ODT) 4  MG disintegrating tablet Take 1 tablet (4 mg total) by mouth every 6 (six) hours as needed for nausea. (Patient not taking: Reported on 03/23/2017) 20 tablet 0 Not Taking  . Prenatal Vit-Fe Fumarate-FA (PRENATAL MULTIVITAMIN) TABS tablet Take 1 tablet by mouth at bedtime.   Taking  . promethazine (PHENERGAN) 25 MG tablet Take 0.5-1 tablets (12.5-25 mg total) by mouth every 6 (six) hours as needed for nausea. (Patient not taking: Reported on 03/23/2017) 30 tablet 0 Not Taking  . ranitidine (ZANTAC) 150 MG tablet Take 1 tablet (150 mg total) by mouth 2 (two) times daily. 60 tablet 3 Taking    Review of Systems  Constitutional: Negative.   Gastrointestinal: Negative.   Genitourinary: Negative.    Physical Exam   Blood pressure 122/75, pulse 95, temperature 98.3 F (36.8 C), temperature source Oral, resp. rate 18, height  (1.702 m), weight 289 lb (131.1 kg), last menstrual period 08/13/2016, SpO2 100 %, currently breastfeeding.  Physical Exam  Nursing note and vitals reviewed. Constitutional: She is oriented to person, place, and time. She appears well-developed and well-nourished. No distress.  HENT:  Head: Normocephalic and atraumatic.  Eyes: Conjunctivae are normal. Right eye exhibits no discharge. Left eye exhibits no discharge. No scleral icterus.  Neck: Normal range of motion.  Respiratory: Effort normal and breath sounds normal. No respiratory distress.  GI: Soft. There is no tenderness.  Neurological: She is alert and oriented to person, place, and time.  Skin: Skin is warm and dry. She is not diaphoretic.  Psychiatric: She has a normal mood and affect. Her behavior is normal. Judgment and thought content normal.   Fetal Tracing:  Baseline: 135 Variability: moderate Accelerations: 15x15 Decelerations: none  Toco: none MAU Course  Procedures No results found for this or any previous visit (from the past 48 hour(s)).  MDM Reactive tracing Repeat BPP @ 10 pm Care  turned over to [facility]  Judeth Horn, NP 04/02/2017 10:05 PM     BPP 8/8; patient has had prolonged monitoring and a reactive fetal heart rate throughout. FHR is 135 with mod var, present acel, neg decels, no contractions.   Assessment and Plan   1. FHR (fetal heart rate) reactive   2. [redacted] weeks gestation of pregnancy   3. Gestational diabetes mellitus (GDM) controlled on oral hypoglycemic drug    2. Patient stable for discharge; return precautions reviewed.   Luna Kitchens CNM

## 2017-04-02 NOTE — Discharge Instructions (Signed)

## 2017-04-02 NOTE — ED Notes (Signed)
Report called to [place], Charity fundraiser.  Pt ambulated and signed into MAU for further monitoring per Dr. Vincenza Hews.

## 2017-04-06 ENCOUNTER — Ambulatory Visit (INDEPENDENT_AMBULATORY_CARE_PROVIDER_SITE_OTHER): Payer: BLUE CROSS/BLUE SHIELD | Admitting: Obstetrics and Gynecology

## 2017-04-06 VITALS — BP 117/61 | HR 99 | Wt 288.5 lb

## 2017-04-06 DIAGNOSIS — O0993 Supervision of high risk pregnancy, unspecified, third trimester: Secondary | ICD-10-CM

## 2017-04-06 DIAGNOSIS — O24419 Gestational diabetes mellitus in pregnancy, unspecified control: Secondary | ICD-10-CM

## 2017-04-06 DIAGNOSIS — O283 Abnormal ultrasonic finding on antenatal screening of mother: Secondary | ICD-10-CM | POA: Insufficient documentation

## 2017-04-06 DIAGNOSIS — O3660X0 Maternal care for excessive fetal growth, unspecified trimester, not applicable or unspecified: Secondary | ICD-10-CM

## 2017-04-06 DIAGNOSIS — IMO0002 Reserved for concepts with insufficient information to code with codable children: Secondary | ICD-10-CM | POA: Insufficient documentation

## 2017-04-06 DIAGNOSIS — Z23 Encounter for immunization: Secondary | ICD-10-CM

## 2017-04-06 DIAGNOSIS — Z3483 Encounter for supervision of other normal pregnancy, third trimester: Secondary | ICD-10-CM

## 2017-04-06 DIAGNOSIS — Z6841 Body Mass Index (BMI) 40.0 and over, adult: Secondary | ICD-10-CM | POA: Insufficient documentation

## 2017-04-06 DIAGNOSIS — O24415 Gestational diabetes mellitus in pregnancy, controlled by oral hypoglycemic drugs: Secondary | ICD-10-CM

## 2017-04-06 DIAGNOSIS — O403XX0 Polyhydramnios, third trimester, not applicable or unspecified: Secondary | ICD-10-CM | POA: Insufficient documentation

## 2017-04-06 DIAGNOSIS — O34219 Maternal care for unspecified type scar from previous cesarean delivery: Secondary | ICD-10-CM

## 2017-04-06 DIAGNOSIS — O99210 Obesity complicating pregnancy, unspecified trimester: Secondary | ICD-10-CM | POA: Insufficient documentation

## 2017-04-06 DIAGNOSIS — O24410 Gestational diabetes mellitus in pregnancy, diet controlled: Secondary | ICD-10-CM

## 2017-04-06 HISTORY — DX: Reserved for concepts with insufficient information to code with codable children: IMO0002

## 2017-04-06 HISTORY — DX: Maternal care for excessive fetal growth, unspecified trimester, not applicable or unspecified: O36.60X0

## 2017-04-06 LAB — POCT URINALYSIS DIP (DEVICE)
Bilirubin Urine: NEGATIVE
GLUCOSE, UA: NEGATIVE mg/dL
Hgb urine dipstick: NEGATIVE
KETONES UR: 40 mg/dL — AB
Leukocytes, UA: NEGATIVE
Nitrite: NEGATIVE
PROTEIN: NEGATIVE mg/dL
UROBILINOGEN UA: 1 mg/dL (ref 0.0–1.0)
pH: 6 (ref 5.0–8.0)

## 2017-04-06 LAB — OB RESULTS CONSOLE GBS: GBS: POSITIVE

## 2017-04-06 LAB — GLUCOSE, CAPILLARY: Glucose-Capillary: 94 mg/dL (ref 65–99)

## 2017-04-06 MED ORDER — TETANUS-DIPHTH-ACELL PERTUSSIS 5-2.5-18.5 LF-MCG/0.5 IM SUSP
0.5000 mL | Freq: Once | INTRAMUSCULAR | Status: AC
  Administered 2017-04-06: 0.5 mL via INTRAMUSCULAR

## 2017-04-06 MED ORDER — GLYBURIDE 2.5 MG PO TABS: ORAL_TABLET | ORAL | 3 refills | Status: DC

## 2017-04-06 NOTE — Progress Notes (Signed)
States has a migraine headache sometimes.

## 2017-04-06 NOTE — Progress Notes (Signed)
Prenatal Visit Note Date: 04/06/2017 Clinic: [facility]  Subjective:  [name] is a 31 y.o. [phone] at [redacted]w[redacted]d being seen today for ongoing prenatal care.  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  Patient reports no complaints.   Contractions: Not present. Vag. Bleeding: None.  Movement: Present. Denies leaking of fluid.   The following portions of the patient's history were reviewed and updated as appropriate: allergies, current medications, past family history, past medical history, past social history, past surgical history and problem list. Problem list updated.  Objective:   Vitals:   04/06/17 1021  BP: 117/61  Pulse: 99  Weight: 288 lb 8 oz (130.9 kg)    Fetal Status: Fetal Heart Rate (bpm): 140   Movement: Present     General:  Alert, oriented and cooperative. Patient is in no acute distress.  Skin: Skin is warm and dry. No rash noted.   Cardiovascular: Normal heart rate noted  Respiratory: Normal respiratory effort, no problems with respiration noted  Abdomen: Soft, gravid, appropriate for gestational age. Pain/Pressure: Absent     Pelvic:  Cervical exam deferred        Extremities: Normal range of motion.  Edema: None  Mental Status: Normal mood and affect. Normal behavior. Normal judgment and thought content.   Urinalysis: Urine Protein: Negative Urine Glucose: Negative  Assessment and Plan:  Pregnancy: G4P3003 at [redacted]w[redacted]d  1. Supervision of high risk pregnancy Routine care. Pt doesn't want anymore children  but doesn't want a BTL - Flu Vaccine QUAD 36+ mos IM - Tdap (BOOSTRIX) injection 0.5 mL; Inject 0.5 mLs into the muscle once.  2. GDMa2 On glyburide 2.5 with breakfast and qhs. BS values are borderline for increasing but given her GA and u/s findings, pt increased to 3.75 with breakfast and qhs. Random BS 94. Getting qwk BPPs already  3. GDM, class A2 - Flu Vaccine QUAD 36+ mos IM - Tdap (BOOSTRIX) injection 0.5 mL; Inject 0.5 mLs into the muscle once.  4. VBAC (vaginal birth after Cesarean) Pt desires another VBAC. Have pt sign form nv  6. Excessive fetal growth affecting management of pregnancy, antepartum, single or unspecified fetus LGA on 9/27 u/s. Normal anatomy. Largest prior VD was over 9lbs  7. Polyhydramnios affecting pregnancy in third trimester 29 on 9/27. See above  8. BMI 40.0-44.9, adult (HCC)   9. Obesity in pregnancy with antepartum complication   10. Umbilical Vein Varix inbasket message sent to Dr. Vincenza Hews and Clarisa Fling to see if they wanted 2x/week BPPs like they have in their note but she was only set up for qwk ones after her 9/27 bpp. F/u u/s tomorrow. D/w pt will need IOL at 37wks which can be set up next visit.   Preterm labor symptoms and general obstetric precautions including but not limited to vaginal bleeding, contractions, leaking of fluid and fetal movement were reviewed in detail with the patient. Please refer to After Visit Summary for other counseling recommendations.  Return in about 10 days (around 04/16/2017) for rob.   [place] Bing, MD

## 2017-04-07 ENCOUNTER — Encounter (HOSPITAL_COMMUNITY): Payer: Self-pay

## 2017-04-07 ENCOUNTER — Other Ambulatory Visit (HOSPITAL_COMMUNITY): Payer: Self-pay | Admitting: Obstetrics and Gynecology

## 2017-04-07 ENCOUNTER — Ambulatory Visit (HOSPITAL_COMMUNITY)
Admission: RE | Admit: 2017-04-07 | Discharge: 2017-04-07 | Disposition: A | Discharge status: Home / Self Care | Payer: BLUE CROSS/BLUE SHIELD | Source: Ambulatory Visit | Attending: Obstetrics and Gynecology | Admitting: Obstetrics and Gynecology

## 2017-04-07 DIAGNOSIS — Z3A33 33 weeks gestation of pregnancy: Secondary | ICD-10-CM | POA: Diagnosis not present

## 2017-04-07 DIAGNOSIS — O99213 Obesity complicating pregnancy, third trimester: Secondary | ICD-10-CM | POA: Insufficient documentation

## 2017-04-07 DIAGNOSIS — O24415 Gestational diabetes mellitus in pregnancy, controlled by oral hypoglycemic drugs: Secondary | ICD-10-CM | POA: Diagnosis not present

## 2017-04-07 DIAGNOSIS — O358XX0 Maternal care for other (suspected) fetal abnormality and damage, not applicable or unspecified: Secondary | ICD-10-CM | POA: Diagnosis not present

## 2017-04-09 ENCOUNTER — Other Ambulatory Visit (HOSPITAL_COMMUNITY): Payer: Self-pay | Admitting: *Deleted

## 2017-04-09 ENCOUNTER — Other Ambulatory Visit: Payer: Self-pay | Admitting: Family Medicine

## 2017-04-09 ENCOUNTER — Encounter (HOSPITAL_COMMUNITY): Payer: Self-pay

## 2017-04-09 ENCOUNTER — Ambulatory Visit (HOSPITAL_COMMUNITY)
Admission: RE | Admit: 2017-04-09 | Discharge: 2017-04-09 | Disposition: A | Discharge status: Home / Self Care | Payer: BLUE CROSS/BLUE SHIELD | Source: Ambulatory Visit | Attending: Obstetrics and Gynecology | Admitting: Obstetrics and Gynecology

## 2017-04-09 DIAGNOSIS — O34211 Maternal care for low transverse scar from previous cesarean delivery: Secondary | ICD-10-CM | POA: Diagnosis not present

## 2017-04-09 DIAGNOSIS — O99213 Obesity complicating pregnancy, third trimester: Secondary | ICD-10-CM

## 2017-04-09 DIAGNOSIS — O359XX0 Maternal care for (suspected) fetal abnormality and damage, unspecified, not applicable or unspecified: Secondary | ICD-10-CM

## 2017-04-09 DIAGNOSIS — Z3A33 33 weeks gestation of pregnancy: Secondary | ICD-10-CM

## 2017-04-09 DIAGNOSIS — O358XX0 Maternal care for other (suspected) fetal abnormality and damage, not applicable or unspecified: Secondary | ICD-10-CM

## 2017-04-09 DIAGNOSIS — O24410 Gestational diabetes mellitus in pregnancy, diet controlled: Secondary | ICD-10-CM

## 2017-04-09 DIAGNOSIS — Z3A34 34 weeks gestation of pregnancy: Secondary | ICD-10-CM

## 2017-04-09 DIAGNOSIS — O24415 Gestational diabetes mellitus in pregnancy, controlled by oral hypoglycemic drugs: Secondary | ICD-10-CM | POA: Diagnosis not present

## 2017-04-14 ENCOUNTER — Encounter (HOSPITAL_COMMUNITY): Payer: Self-pay

## 2017-04-14 ENCOUNTER — Ambulatory Visit (HOSPITAL_COMMUNITY)
Admission: RE | Admit: 2017-04-14 | Discharge: 2017-04-14 | Disposition: A | Discharge status: Home / Self Care | Payer: BLUE CROSS/BLUE SHIELD | Source: Ambulatory Visit | Attending: Obstetrics & Gynecology | Admitting: Obstetrics & Gynecology

## 2017-04-14 ENCOUNTER — Other Ambulatory Visit (HOSPITAL_COMMUNITY): Payer: Self-pay | Admitting: Maternal and Fetal Medicine

## 2017-04-14 DIAGNOSIS — O358XX0 Maternal care for other (suspected) fetal abnormality and damage, not applicable or unspecified: Secondary | ICD-10-CM

## 2017-04-14 DIAGNOSIS — O283 Abnormal ultrasonic finding on antenatal screening of mother: Secondary | ICD-10-CM

## 2017-04-14 DIAGNOSIS — O99213 Obesity complicating pregnancy, third trimester: Secondary | ICD-10-CM | POA: Insufficient documentation

## 2017-04-14 DIAGNOSIS — Z3A34 34 weeks gestation of pregnancy: Secondary | ICD-10-CM | POA: Diagnosis not present

## 2017-04-14 DIAGNOSIS — O24415 Gestational diabetes mellitus in pregnancy, controlled by oral hypoglycemic drugs: Secondary | ICD-10-CM | POA: Diagnosis not present

## 2017-04-14 DIAGNOSIS — O34211 Maternal care for low transverse scar from previous cesarean delivery: Secondary | ICD-10-CM | POA: Insufficient documentation

## 2017-04-15 NOTE — Procedures (Signed)
[name] [birth year] [redacted]w[redacted]d  Fetus A Non-Stress Test Interpretation for 04/15/17  Indication: Unsatisfactory BPP  Fetal Heart Rate A Mode: External Baseline Rate (A): 135 bpm Variability: Moderate Accelerations: 15 x 15 Decelerations: None Multiple birth?: No  Uterine Activity Mode: Toco Contraction Frequency (min): Occ UC noted Contraction Duration (sec): 60-80 Contraction Quality: Mild Resting Tone Palpated: Relaxed Resting Time: Adequate  Interpretation (Fetal Testing) Nonstress Test Interpretation: Reactive Comments: FHR tracing rev'd by Dr. Sherrie George

## 2017-04-16 ENCOUNTER — Other Ambulatory Visit (HOSPITAL_COMMUNITY): Payer: BLUE CROSS/BLUE SHIELD

## 2017-04-16 ENCOUNTER — Ambulatory Visit (HOSPITAL_COMMUNITY): Payer: BLUE CROSS/BLUE SHIELD

## 2017-04-17 ENCOUNTER — Ambulatory Visit (INDEPENDENT_AMBULATORY_CARE_PROVIDER_SITE_OTHER): Payer: BLUE CROSS/BLUE SHIELD | Admitting: Obstetrics & Gynecology

## 2017-04-17 ENCOUNTER — Ambulatory Visit (HOSPITAL_COMMUNITY)
Admission: RE | Admit: 2017-04-17 | Discharge: 2017-04-17 | Disposition: A | Discharge status: Home / Self Care | Payer: BLUE CROSS/BLUE SHIELD | Source: Ambulatory Visit | Attending: Obstetrics and Gynecology | Admitting: Obstetrics and Gynecology

## 2017-04-17 ENCOUNTER — Telehealth (HOSPITAL_COMMUNITY): Payer: Self-pay | Admitting: *Deleted

## 2017-04-17 ENCOUNTER — Encounter (HOSPITAL_COMMUNITY): Payer: Self-pay

## 2017-04-17 DIAGNOSIS — O24415 Gestational diabetes mellitus in pregnancy, controlled by oral hypoglycemic drugs: Secondary | ICD-10-CM | POA: Diagnosis not present

## 2017-04-17 DIAGNOSIS — O99213 Obesity complicating pregnancy, third trimester: Secondary | ICD-10-CM | POA: Insufficient documentation

## 2017-04-17 DIAGNOSIS — O358XX0 Maternal care for other (suspected) fetal abnormality and damage, not applicable or unspecified: Secondary | ICD-10-CM

## 2017-04-17 DIAGNOSIS — Z3A35 35 weeks gestation of pregnancy: Secondary | ICD-10-CM | POA: Insufficient documentation

## 2017-04-17 DIAGNOSIS — O34219 Maternal care for unspecified type scar from previous cesarean delivery: Secondary | ICD-10-CM | POA: Diagnosis not present

## 2017-04-17 DIAGNOSIS — O283 Abnormal ultrasonic finding on antenatal screening of mother: Secondary | ICD-10-CM

## 2017-04-17 NOTE — Patient Instructions (Signed)
Labor Induction Labor induction is when steps are taken to cause a pregnant woman to begin the labor process. Most women go into labor on their own between 37 weeks and 42 weeks of the pregnancy. When this does not happen or when there is a medical need, methods may be used to induce labor. Labor induction causes a pregnant woman's uterus to contract. It also causes the cervix to soften (ripen), open (dilate), and thin out (efface). Usually, labor is not induced before 39 weeks of the pregnancy unless there is a problem with the baby or mother. Before inducing labor, your health care provider will consider a number of factors, including the following:  The medical condition of you and the baby.  How many weeks along you are.  The status of the baby's lung maturity.  The condition of the cervix.  The position of the baby. What are the reasons for labor induction? Labor may be induced for the following reasons:  The health of the baby or mother is at risk.  The pregnancy is overdue by 1 week or more.  The water breaks but labor does not start on its own.  The mother has a health condition or serious illness, such as high blood pressure, infection, placental abruption, or diabetes.  The amniotic fluid amounts are low around the baby.  The baby is distressed. Convenience or wanting the baby to be born on a certain date is not a reason for inducing labor. What methods are used for labor induction? Several methods of labor induction may be used, such as:  Prostaglandin medicine. This medicine causes the cervix to dilate and ripen. The medicine will also start contractions. It can be taken by mouth or by inserting a suppository into the vagina.  Inserting a thin tube (catheter) with a balloon on the end into the vagina to dilate the cervix. Once inserted, the balloon is expanded with water, which causes the cervix to open.  Stripping the membranes. Your health care provider separates  amniotic sac tissue from the cervix, causing the cervix to be stretched and causing the release of a hormone called progesterone. This may cause the uterus to contract. It is often done during an office visit. You will be sent home to wait for the contractions to begin. You will then come in for an induction.  Breaking the water. Your health care provider makes a hole in the amniotic sac using a small instrument. Once the amniotic sac breaks, contractions should begin. This may still take hours to see an effect.  Medicine to trigger or strengthen contractions. This medicine is given through an IV access tube inserted into a vein in your arm. All of the methods of induction, besides stripping the membranes, will be done in the hospital. Induction is done in the hospital so that you and the baby can be carefully monitored. How long does it take for labor to be induced? Some inductions can take up to 2-3 days. Depending on the cervix, it usually takes less time. It takes longer when you are induced early in the pregnancy or if this is your first pregnancy. If a mother is still pregnant and the induction has been going on for 2-3 days, either the mother will be sent home or a cesarean delivery will be needed. What are the risks associated with labor induction? Some of the risks of induction include:  Changes in fetal heart rate, such as too high, too low, or erratic.  Fetal distress.    Chance of infection for the mother and baby.  Increased chance of having a cesarean delivery.  Breaking off (abruption) of the placenta from the uterus (rare).  Uterine rupture (very rare). When induction is needed for medical reasons, the benefits of induction may outweigh the risks. What are some reasons for not inducing labor? Labor induction should not be done if:  It is shown that your baby does not tolerate labor.  You have had previous surgeries on your uterus, such as a myomectomy or the removal of  fibroids.  Your placenta lies very low in the uterus and blocks the opening of the cervix (placenta previa).  Your baby is not in a head-down position.  The umbilical cord drops down into the birth canal in front of the baby. This could cut off the baby's blood and oxygen supply.  You have had a previous cesarean delivery.  There are unusual circumstances, such as the baby being extremely premature. This information is not intended to replace advice given to you by your health care provider. Make sure you discuss any questions you have with your health care provider. Document Released: 11/12/2006 Document Revised: 11/29/2015 Document Reviewed: 01/20/2013 Elsevier Interactive Patient Education  2017 [employer]  

## 2017-04-17 NOTE — Progress Notes (Signed)
   PRENATAL VISIT NOTE  Subjective:  [name] is a 31 y.o. G4P3003 at [redacted]w[redacted]d being seen today for ongoing prenatal care.  She is currently monitored for the following issues for this high-risk pregnancy and has Supervision of high risk pregnancy, antepartum, third trimester; Gestational diabetes; VBAC (vaginal birth after Cesarean); LGA (large for gestational age) fetus affecting management of mother; Polyhydramnios affecting pregnancy in third trimester; BMI 40.0-44.9, adult (HCC); Obesity in pregnancy with antepartum complication; and Umbilical Vein Varix on her problem list.  Patient reports no complaints.  Contractions: Not present.  .  Movement: Present. Denies leaking of fluid.   The following portions of the patient's history were reviewed and updated as appropriate: allergies, current medications, past family history, past medical history, past social history, past surgical history and problem list. Problem list updated.  Objective:   Vitals:   04/17/17 0810  BP: 113/61  Pulse: (!) 101  Weight: 288 lb (130.6 kg)    Fetal Status: Fetal Heart Rate (bpm): 154   Movement: Present     General:  Alert, oriented and cooperative. Patient is in no acute distress.  Skin: Skin is warm and dry. No rash noted.   Cardiovascular: Normal heart rate noted  Respiratory: Normal respiratory effort, no problems with respiration noted  Abdomen: Soft, gravid, appropriate for gestational age.  Pain/Pressure: Present     Pelvic: Cervical exam deferred        Extremities: Normal range of motion.     Mental Status:  Normal mood and affect. Normal behavior. Normal judgment and thought content.   Assessment and Plan:  Pregnancy: G4P3003 at [redacted]w[redacted]d  1. Umbilical Vein Varix IOL 37 weeks  Diabetes control is stable and will continue present medication   Preterm labor symptoms and general obstetric precautions including but not limited to vaginal bleeding, contractions, leaking of fluid and fetal  movement were reviewed in detail with the patient. Please refer to After Visit Summary for other counseling recommendations.  Return in about 1 week (around 04/24/2017).   Scheryl Darter, MD

## 2017-04-17 NOTE — Telephone Encounter (Signed)
Preadmission screen  

## 2017-04-21 ENCOUNTER — Other Ambulatory Visit (HOSPITAL_COMMUNITY): Payer: Self-pay | Admitting: Maternal and Fetal Medicine

## 2017-04-21 ENCOUNTER — Encounter (HOSPITAL_COMMUNITY): Payer: Self-pay

## 2017-04-21 ENCOUNTER — Ambulatory Visit (HOSPITAL_COMMUNITY)
Admission: RE | Admit: 2017-04-21 | Discharge: 2017-04-21 | Disposition: A | Discharge status: Home / Self Care | Payer: BLUE CROSS/BLUE SHIELD | Source: Ambulatory Visit | Attending: Obstetrics & Gynecology | Admitting: Obstetrics & Gynecology

## 2017-04-21 DIAGNOSIS — O99210 Obesity complicating pregnancy, unspecified trimester: Secondary | ICD-10-CM

## 2017-04-21 DIAGNOSIS — O24410 Gestational diabetes mellitus in pregnancy, diet controlled: Secondary | ICD-10-CM

## 2017-04-21 DIAGNOSIS — O24415 Gestational diabetes mellitus in pregnancy, controlled by oral hypoglycemic drugs: Secondary | ICD-10-CM | POA: Diagnosis not present

## 2017-04-21 DIAGNOSIS — O358XX0 Maternal care for other (suspected) fetal abnormality and damage, not applicable or unspecified: Secondary | ICD-10-CM

## 2017-04-21 DIAGNOSIS — Z3A35 35 weeks gestation of pregnancy: Secondary | ICD-10-CM

## 2017-04-21 DIAGNOSIS — O34219 Maternal care for unspecified type scar from previous cesarean delivery: Secondary | ICD-10-CM | POA: Diagnosis not present

## 2017-04-21 DIAGNOSIS — O99213 Obesity complicating pregnancy, third trimester: Secondary | ICD-10-CM | POA: Insufficient documentation

## 2017-04-22 ENCOUNTER — Other Ambulatory Visit: Payer: Self-pay | Admitting: Advanced Practice Midwife

## 2017-04-24 ENCOUNTER — Other Ambulatory Visit (HOSPITAL_COMMUNITY): Payer: Self-pay | Admitting: *Deleted

## 2017-04-24 ENCOUNTER — Ambulatory Visit (INDEPENDENT_AMBULATORY_CARE_PROVIDER_SITE_OTHER): Payer: BLUE CROSS/BLUE SHIELD | Admitting: Family Medicine

## 2017-04-24 ENCOUNTER — Other Ambulatory Visit (HOSPITAL_COMMUNITY)
Admission: RE | Admit: 2017-04-24 | Discharge: 2017-04-24 | Disposition: A | Discharge status: Home / Self Care | Payer: BLUE CROSS/BLUE SHIELD | Source: Ambulatory Visit | Attending: Family Medicine | Admitting: Family Medicine

## 2017-04-24 ENCOUNTER — Other Ambulatory Visit (HOSPITAL_COMMUNITY): Payer: Self-pay | Admitting: Maternal and Fetal Medicine

## 2017-04-24 ENCOUNTER — Encounter (HOSPITAL_COMMUNITY): Payer: Self-pay

## 2017-04-24 ENCOUNTER — Ambulatory Visit (HOSPITAL_COMMUNITY)
Admission: RE | Admit: 2017-04-24 | Discharge: 2017-04-24 | Disposition: A | Discharge status: Home / Self Care | Payer: BLUE CROSS/BLUE SHIELD | Source: Ambulatory Visit | Attending: Obstetrics and Gynecology | Admitting: Obstetrics and Gynecology

## 2017-04-24 VITALS — BP 122/71 | HR 107 | Wt 290.0 lb

## 2017-04-24 DIAGNOSIS — O24415 Gestational diabetes mellitus in pregnancy, controlled by oral hypoglycemic drugs: Secondary | ICD-10-CM

## 2017-04-24 DIAGNOSIS — O0993 Supervision of high risk pregnancy, unspecified, third trimester: Secondary | ICD-10-CM

## 2017-04-24 DIAGNOSIS — O34219 Maternal care for unspecified type scar from previous cesarean delivery: Secondary | ICD-10-CM

## 2017-04-24 DIAGNOSIS — Z113 Encounter for screening for infections with a predominantly sexual mode of transmission: Secondary | ICD-10-CM | POA: Diagnosis not present

## 2017-04-24 DIAGNOSIS — O358XX0 Maternal care for other (suspected) fetal abnormality and damage, not applicable or unspecified: Secondary | ICD-10-CM | POA: Diagnosis not present

## 2017-04-24 DIAGNOSIS — O283 Abnormal ultrasonic finding on antenatal screening of mother: Secondary | ICD-10-CM

## 2017-04-24 DIAGNOSIS — O24410 Gestational diabetes mellitus in pregnancy, diet controlled: Secondary | ICD-10-CM

## 2017-04-24 DIAGNOSIS — Z3A36 36 weeks gestation of pregnancy: Secondary | ICD-10-CM | POA: Diagnosis not present

## 2017-04-24 DIAGNOSIS — O403XX0 Polyhydramnios, third trimester, not applicable or unspecified: Secondary | ICD-10-CM

## 2017-04-24 DIAGNOSIS — O99213 Obesity complicating pregnancy, third trimester: Secondary | ICD-10-CM | POA: Diagnosis not present

## 2017-04-24 NOTE — Progress Notes (Signed)
    PRENATAL VISIT NOTE  Subjective:  [name] OURO-Akondo is a 31 y.o. G4P3003 at 2041w2d being seen today for ongoing prenatal care.  She is currently monitored for the following issues for this high-risk pregnancy and has Supervision of high risk pregnancy, antepartum, third trimester; Gestational diabetes; VBAC (vaginal birth after Cesarean); LGA (large for gestational age) fetus affecting management of mother; Polyhydramnios affecting pregnancy in third trimester; BMI 40.0-44.9, adult (HCC); Obesity in pregnancy with antepartum complication; and Umbilical Vein Varix on her problem list.  Patient reports no complaints.  Contractions: Irregular. Vag. Bleeding: None.  Movement: Present. Denies leaking of fluid.   The following portions of the patient's history were reviewed and updated as appropriate: allergies, current medications, past family history, past medical history, past social history, past surgical history and problem list. Problem list updated.  Objective:   Vitals:   04/24/17 0847  BP: 122/71  Pulse: (!) 107  Weight: 290 lb (131.5 kg)    Fetal Status: Fetal Heart Rate (bpm): 141 Fundal Height: 45 cm Movement: Present     General:  Alert, oriented and cooperative. Patient is in no acute distress.  Skin: Skin is warm and dry. No rash noted.   Cardiovascular: Normal heart rate noted  Respiratory: Normal respiratory effort, no problems with respiration noted  Abdomen: Soft, gravid, appropriate for gestational age.  Pain/Pressure: Present     Pelvic: Cervical exam performed Dilation: 1.5 Effacement (%): 50 Station: -2  Extremities: Normal range of motion.  Edema: Trace  Mental Status:  Normal mood and affect. Normal behavior. Normal judgment and thought content.  FBS 87-106 (2 of seven are out of range) 2 hour pp 63-124 (most are in range) Assessment and Plan:  Pregnancy: G4P3003 at 8341w2d  1. Supervision of high risk pregnancy, antepartum, third trimester Cultures today -  [facility]lam[facility] - Culture, beta strep (group b only)  2. Oral medication controlled gestational diabetes mellitus (GDM) in third trimester Continue glyburide Excellent control.  3. Polyhydramnios affecting pregnancy in third trimester For weekly testing through MFM  4. Umbilical Vein Varix For IOL at 37 wks  5. VBAC (vaginal birth after Cesarean) For IOL and TOLAC attempt # 3.  Term labor symptoms and general obstetric precautions including but not limited to vaginal bleeding, contractions, leaking of fluid and fetal movement were reviewed in detail with the patient. Please refer to After Visit Summary for other counseling recommendations.  Return in 6 weeks (on 06/05/2017) for pp check with [facility].   Reva Boresanya S Wendal Wilkie, MD

## 2017-04-24 NOTE — Patient Instructions (Signed)
 Third Trimester of Pregnancy The third trimester is from week 28 through week 40 (months 7 through 9). The third trimester is a time when the unborn baby (fetus) is growing rapidly. At the end of the ninth month, the fetus is about 20 inches in length and weighs 6-10 pounds. Body changes during your third trimester Your body will continue to go through many changes during pregnancy. The changes vary from woman to woman. During the third trimester:  Your weight will continue to increase. You can expect to gain 25-35 pounds (11-16 kg) by the end of the pregnancy.  You may begin to get stretch marks on your hips, abdomen, and breasts.  You may urinate more often because the fetus is moving lower into your pelvis and pressing on your bladder.  You may develop or continue to have heartburn. This is caused by increased hormones that slow down muscles in the digestive tract.  You may develop or continue to have constipation because increased hormones slow digestion and cause the muscles that push waste through your intestines to relax.  You may develop hemorrhoids. These are swollen veins (varicose veins) in the rectum that can itch or be painful.  You may develop swollen, bulging veins (varicose veins) in your legs.  You may have increased body aches in the pelvis, back, or thighs. This is due to weight gain and increased hormones that are relaxing your joints.  You may have changes in your hair. These can include thickening of your hair, rapid growth, and changes in texture. Some women also have hair loss during or after pregnancy, or hair that feels dry or thin. Your hair will most likely return to normal after your baby is born.  Your breasts will continue to grow and they will continue to become tender. A yellow fluid (colostrum) may leak from your breasts. This is the first milk you are producing for your baby.  Your belly button may stick out.  You may notice more swelling in your  hands, face, or ankles.  You may have increased tingling or numbness in your hands, arms, and legs. The skin on your belly may also feel numb.  You may feel short of breath because of your expanding uterus.  You may have more problems sleeping. This can be caused by the size of your belly, increased need to urinate, and an increase in your body's metabolism.  You may notice the fetus "dropping," or moving lower in your abdomen (lightening).  You may have increased vaginal discharge.  You may notice your joints feel loose and you may have pain around your pelvic bone.  What to expect at prenatal visits You will have prenatal exams every 2 weeks until week 36. Then you will have weekly prenatal exams. During a routine prenatal visit:  You will be weighed to make sure you and the baby are growing normally.  Your blood pressure will be taken.  Your abdomen will be measured to track your baby's growth.  The fetal heartbeat will be listened to.  Any test results from the previous visit will be discussed.  You may have a cervical check near your due date to see if your cervix has softened or thinned (effaced).  You will be tested for Group B streptococcus. This happens between 35 and 37 weeks.  Your health care provider may ask you:  What your birth plan is.  How you are feeling.  If you are feeling the baby move.  If you have   had any abnormal symptoms, such as leaking fluid, bleeding, severe headaches, or abdominal cramping.  If you are using any tobacco products, including cigarettes, chewing tobacco, and electronic cigarettes.  If you have any questions.  Other tests or screenings that may be performed during your third trimester include:  Blood tests that check for low iron levels (anemia).  Fetal testing to check the health, activity level, and growth of the fetus. Testing is done if you have certain medical conditions or if there are problems during the  pregnancy.  Nonstress test (NST). This test checks the health of your baby to make sure there are no signs of problems, such as the baby not getting enough oxygen. During this test, a belt is placed around your belly. The baby is made to move, and its heart rate is monitored during movement.  What is false labor? False labor is a condition in which you feel small, irregular tightenings of the muscles in the womb (contractions) that usually go away with rest, changing position, or drinking water. These are called Braxton Hicks contractions. Contractions may last for hours, days, or even weeks before true labor sets in. If contractions come at regular intervals, become more frequent, increase in intensity, or become painful, you should see your health care provider. What are the signs of labor?  Abdominal cramps.  Regular contractions that start at 10 minutes apart and become stronger and more frequent with time.  Contractions that start on the top of the uterus and spread down to the lower abdomen and back.  Increased pelvic pressure and dull back pain.  A watery or bloody mucus discharge that comes from the vagina.  Leaking of amniotic fluid. This is also known as your "water breaking." It could be a slow trickle or a gush. Let your health care provider know if it has a color or strange odor. If you have any of these signs, call your health care provider right away, even if it is before your due date. Follow these instructions at home: Medicines  Follow your health care provider's instructions regarding medicine use. Specific medicines may be either safe or unsafe to take during pregnancy.  Take a prenatal vitamin that contains at least 600 micrograms (mcg) of folic acid.  If you develop constipation, try taking a stool softener if your health care provider approves. Eating and drinking  Eat a balanced diet that includes fresh fruits and vegetables, whole grains, good sources of protein  such as meat, eggs, or tofu, and low-fat dairy. Your health care provider will help you determine the amount of weight gain that is right for you.  Avoid raw meat and uncooked cheese. These carry germs that can cause birth defects in the baby.  If you have low calcium intake from food, talk to your health care provider about whether you should take a daily calcium supplement.  Eat four or five small meals rather than three large meals a day.  Limit foods that are high in fat and processed sugars, such as fried and sweet foods.  To prevent constipation: ? Drink enough fluid to keep your urine clear or pale yellow. ? Eat foods that are high in fiber, such as fresh fruits and vegetables, whole grains, and beans. Activity  Exercise only as directed by your health care provider. Most women can continue their usual exercise routine during pregnancy. Try to exercise for 30 minutes at least 5 days a week. Stop exercising if you experience uterine contractions.  Avoid   heavy lifting.  Do not exercise in extreme heat or humidity, or at high altitudes.  Wear low-heel, comfortable shoes.  Practice good posture.  You may continue to have sex unless your health care provider tells you otherwise. Relieving pain and discomfort  Take frequent breaks and rest with your legs elevated if you have leg cramps or low back pain.  Take warm sitz baths to soothe any pain or discomfort caused by hemorrhoids. Use hemorrhoid cream if your health care provider approves.  Wear a good support bra to prevent discomfort from breast tenderness.  If you develop varicose veins: ? Wear support pantyhose or compression stockings as told by your healthcare provider. ? Elevate your feet for 15 minutes, 3-4 times a day. Prenatal care  Write down your questions. Take them to your prenatal visits.  Keep all your prenatal visits as told by your health care provider. This is important. Safety  Wear your seat belt at  all times when driving.  Make a list of emergency phone numbers, including numbers for family, friends, the hospital, and police and fire departments. General instructions  Avoid cat litter boxes and soil used by cats. These carry germs that can cause birth defects in the baby. If you have a cat, ask someone to clean the litter box for you.  Do not travel far distances unless it is absolutely necessary and only with the approval of your health care provider.  Do not use hot tubs, steam rooms, or saunas.  Do not drink alcohol.  Do not use any products that contain nicotine or tobacco, such as cigarettes and e-cigarettes. If you need help quitting, ask your health care provider.  Do not use any medicinal herbs or unprescribed drugs. These chemicals affect the formation and growth of the baby.  Do not douche or use tampons or scented sanitary pads.  Do not cross your legs for long periods of time.  To prepare for the arrival of your baby: ? Take prenatal classes to understand, practice, and ask questions about labor and delivery. ? Make a trial run to the hospital. ? Visit the hospital and tour the maternity area. ? Arrange for maternity or paternity leave through employers. ? Arrange for family and friends to take care of pets while you are in the hospital. ? Purchase a rear-facing car seat and make sure you know how to install it in your car. ? Pack your hospital bag. ? Prepare the baby's nursery. Make sure to remove all pillows and stuffed animals from the baby's crib to prevent suffocation.  Visit your dentist if you have not gone during your pregnancy. Use a soft toothbrush to brush your teeth and be gentle when you floss. Contact a health care provider if:  You are unsure if you are in labor or if your water has broken.  You become dizzy.  You have mild pelvic cramps, pelvic pressure, or nagging pain in your abdominal area.  You have lower back pain.  You have persistent  nausea, vomiting, or diarrhea.  You have an unusual or bad smelling vaginal discharge.  You have pain when you urinate. Get help right away if:  Your water breaks before 37 weeks.  You have regular contractions less than 5 minutes apart before 37 weeks.  You have a fever.  You are leaking fluid from your vagina.  You have spotting or bleeding from your vagina.  You have severe abdominal pain or cramping.  You have rapid weight loss or weight   gain.  You have shortness of breath with chest pain.  You notice sudden or extreme swelling of your face, hands, ankles, feet, or legs.  Your baby makes fewer than 10 movements in 2 hours.  You have severe headaches that do not go away when you take medicine.  You have vision changes. Summary  The third trimester is from week 28 through week 40, months 7 through 9. The third trimester is a time when the unborn baby (fetus) is growing rapidly.  During the third trimester, your discomfort may increase as you and your baby continue to gain weight. You may have abdominal, leg, and back pain, sleeping problems, and an increased need to urinate.  During the third trimester your breasts will keep growing and they will continue to become tender. A yellow fluid (colostrum) may leak from your breasts. This is the first milk you are producing for your baby.  False labor is a condition in which you feel small, irregular tightenings of the muscles in the womb (contractions) that eventually go away. These are called Braxton Hicks contractions. Contractions may last for hours, days, or even weeks before true labor sets in.  Signs of labor can include: abdominal cramps; regular contractions that start at 10 minutes apart and become stronger and more frequent with time; watery or bloody mucus discharge that comes from the vagina; increased pelvic pressure and dull back pain; and leaking of amniotic fluid. This information is not intended to replace advice  given to you by your health care provider. Make sure you discuss any questions you have with your health care provider. Document Released: 06/17/2001 Document Revised: 11/29/2015 Document Reviewed: 08/24/2012 [employer] Interactive Patient Education  2017 [employer]   Breastfeeding Deciding to breastfeed is one of the best choices you can make for you and your baby. A change in hormones during pregnancy causes your breast tissue to grow and increases the number and size of your milk ducts. These hormones also allow proteins, sugars, and fats from your blood supply to make breast milk in your milk-producing glands. Hormones prevent breast milk from being released before your baby is born as well as prompt milk flow after birth. Once breastfeeding has begun, thoughts of your baby, as well as his or her sucking or crying, can stimulate the release of milk from your milk-producing glands. Benefits of breastfeeding For Your Baby  Your first milk (colostrum) helps your baby's digestive system function better.  There are antibodies in your milk that help your baby fight off infections.  Your baby has a lower incidence of asthma, allergies, and sudden infant death syndrome.  The nutrients in breast milk are better for your baby than infant formulas and are designed uniquely for your baby's needs.  Breast milk improves your baby's brain development.  Your baby is less likely to develop other conditions, such as childhood obesity, asthma, or type 2 diabetes mellitus.  For You  Breastfeeding helps to create a very special bond between you and your baby.  Breastfeeding is convenient. Breast milk is always available at the correct temperature and costs nothing.  Breastfeeding helps to burn calories and helps you lose the weight gained during pregnancy.  Breastfeeding makes your uterus contract to its prepregnancy size faster and slows bleeding (lochia) after you give birth.  Breastfeeding helps  to lower your risk of developing type 2 diabetes mellitus, osteoporosis, and breast or ovarian cancer later in life.  Signs that your baby is hungry Early Signs of Hunger    Increased alertness or activity.  Stretching.  Movement of the head from side to side.  Movement of the head and opening of the mouth when the corner of the mouth or cheek is stroked (rooting).  Increased sucking sounds, smacking lips, cooing, sighing, or squeaking.  Hand-to-mouth movements.  Increased sucking of fingers or hands.  Late Signs of Hunger  Fussing.  Intermittent crying.  Extreme Signs of Hunger Signs of extreme hunger will require calming and consoling before your baby will be able to breastfeed successfully. Do not wait for the following signs of extreme hunger to occur before you initiate breastfeeding:  Restlessness.  A loud, strong cry.  Screaming.  Breastfeeding basics Breastfeeding Initiation  Find a comfortable place to sit or lie down, with your neck and back well supported.  Place a pillow or rolled up blanket under your baby to bring him or her to the level of your breast (if you are seated). Nursing pillows are specially designed to help support your arms and your baby while you breastfeed.  Make sure that your baby's abdomen is facing your abdomen.  Gently massage your breast. With your fingertips, massage from your chest wall toward your nipple in a circular motion. This encourages milk flow. You may need to continue this action during the feeding if your milk flows slowly.  Support your breast with 4 fingers underneath and your thumb above your nipple. Make sure your fingers are well away from your nipple and your baby's mouth.  Stroke your baby's lips gently with your finger or nipple.  When your baby's mouth is open wide enough, quickly bring your baby to your breast, placing your entire nipple and as much of the colored area around your nipple (areola) as possible into  your baby's mouth. ? More areola should be visible above your baby's upper lip than below the lower lip. ? Your baby's tongue should be between his or her lower gum and your breast.  Ensure that your baby's mouth is correctly positioned around your nipple (latched). Your baby's lips should create a seal on your breast and be turned out (everted).  It is common for your baby to suck about 2-3 minutes in order to start the flow of breast milk.  Latching Teaching your baby how to latch on to your breast properly is very important. An improper latch can cause nipple pain and decreased milk supply for you and poor weight gain in your baby. Also, if your baby is not latched onto your nipple properly, he or she may swallow some air during feeding. This can make your baby fussy. Burping your baby when you switch breasts during the feeding can help to get rid of the air. However, teaching your baby to latch on properly is still the best way to prevent fussiness from swallowing air while breastfeeding. Signs that your baby has successfully latched on to your nipple:  Silent tugging or silent sucking, without causing you pain.  Swallowing heard between every 3-4 sucks.  Muscle movement above and in front of his or her ears while sucking.  Signs that your baby has not successfully latched on to nipple:  Sucking sounds or smacking sounds from your baby while breastfeeding.  Nipple pain.  If you think your baby has not latched on correctly, slip your finger into the corner of your baby's mouth to break the suction and place it between your baby's gums. Attempt breastfeeding initiation again. Signs of Successful Breastfeeding Signs from your baby:  A   gradual decrease in the number of sucks or complete cessation of sucking.  Falling asleep.  Relaxation of his or her body.  Retention of a small amount of milk in his or her mouth.  Letting go of your breast by himself or herself.  Signs from  you:  Breasts that have increased in firmness, weight, and size 1-3 hours after feeding.  Breasts that are softer immediately after breastfeeding.  Increased milk volume, as well as a change in milk consistency and color by the fifth day of breastfeeding.  Nipples that are not sore, cracked, or bleeding.  Signs That Your Baby is Getting Enough Milk  Wetting at least 1-2 diapers during the first 24 hours after birth.  Wetting at least 5-6 diapers every 24 hours for the first week after birth. The urine should be clear or pale yellow by 5 days after birth.  Wetting 6-8 diapers every 24 hours as your baby continues to grow and develop.  At least 3 stools in a 24-hour period by age 5 days. The stool should be soft and yellow.  At least 3 stools in a 24-hour period by age 7 days. The stool should be seedy and yellow.  No loss of weight greater than 10% of birth weight during the first 3 days of age.  Average weight gain of 4-7 ounces (113-198 g) per week after age 4 days.  Consistent daily weight gain by age 5 days, without weight loss after the age of 2 weeks.  After a feeding, your baby may spit up a small amount. This is common. Breastfeeding frequency and duration Frequent feeding will help you make more milk and can prevent sore nipples and breast engorgement. Breastfeed when you feel the need to reduce the fullness of your breasts or when your baby shows signs of hunger. This is called "breastfeeding on demand." Avoid introducing a pacifier to your baby while you are working to establish breastfeeding (the first 4-6 weeks after your baby is born). After this time you may choose to use a pacifier. Research has shown that pacifier use during the first year of a baby's life decreases the risk of sudden infant death syndrome (SIDS). Allow your baby to feed on each breast as long as he or she wants. Breastfeed until your baby is finished feeding. When your baby unlatches or falls asleep  while feeding from the first breast, offer the second breast. Because newborns are often sleepy in the first few weeks of life, you may need to awaken your baby to get him or her to feed. Breastfeeding times will vary from baby to baby. However, the following rules can serve as a guide to help you ensure that your baby is properly fed:  Newborns (babies 4 weeks of age or younger) may breastfeed every 1-3 hours.  Newborns should not go longer than 3 hours during the day or 5 hours during the night without breastfeeding.  You should breastfeed your baby a minimum of 8 times in a 24-hour period until you begin to introduce solid foods to your baby at around 6 months of age.  Breast milk pumping Pumping and storing breast milk allows you to ensure that your baby is exclusively fed your breast milk, even at times when you are unable to breastfeed. This is especially important if you are going back to work while you are still breastfeeding or when you are not able to be present during feedings. Your lactation consultant can give you guidelines on how   long it is safe to store breast milk. A breast pump is a machine that allows you to pump milk from your breast into a sterile bottle. The pumped breast milk can then be stored in a refrigerator or freezer. Some breast pumps are operated by hand, while others use electricity. Ask your lactation consultant which type will work best for you. Breast pumps can be purchased, but some hospitals and breastfeeding support groups lease breast pumps on a monthly basis. A lactation consultant can teach you how to hand express breast milk, if you prefer not to use a pump. Caring for your breasts while you breastfeed Nipples can become dry, cracked, and sore while breastfeeding. The following recommendations can help keep your breasts moisturized and healthy:  Avoid using soap on your nipples.  Wear a supportive bra. Although not required, special nursing bras and tank  tops are designed to allow access to your breasts for breastfeeding without taking off your entire bra or top. Avoid wearing underwire-style bras or extremely tight bras.  Air dry your nipples for 3-4minutes after each feeding.  Use only cotton bra pads to absorb leaked breast milk. Leaking of breast milk between feedings is normal.  Use lanolin on your nipples after breastfeeding. Lanolin helps to maintain your skin's normal moisture barrier. If you use pure lanolin, you do not need to wash it off before feeding your baby again. Pure lanolin is not toxic to your baby. You may also hand express a few drops of breast milk and gently massage that milk into your nipples and allow the milk to air dry.  In the first few weeks after giving birth, some women experience extremely full breasts (engorgement). Engorgement can make your breasts feel heavy, warm, and tender to the touch. Engorgement peaks within 3-5 days after you give birth. The following recommendations can help ease engorgement:  Completely empty your breasts while breastfeeding or pumping. You may want to start by applying warm, moist heat (in the shower or with warm water-soaked hand towels) just before feeding or pumping. This increases circulation and helps the milk flow. If your baby does not completely empty your breasts while breastfeeding, pump any extra milk after he or she is finished.  Wear a snug bra (nursing or regular) or tank top for 1-2 days to signal your body to slightly decrease milk production.  Apply ice packs to your breasts, unless this is too uncomfortable for you.  Make sure that your baby is latched on and positioned properly while breastfeeding.  If engorgement persists after 48 hours of following these recommendations, contact your health care provider or a lactation consultant. Overall health care recommendations while breastfeeding  Eat healthy foods. Alternate between meals and snacks, eating 3 of each per  day. Because what you eat affects your breast milk, some of the foods may make your baby more irritable than usual. Avoid eating these foods if you are sure that they are negatively affecting your baby.  Drink milk, fruit juice, and water to satisfy your thirst (about 10 glasses a day).  Rest often, relax, and continue to take your prenatal vitamins to prevent fatigue, stress, and anemia.  Continue breast self-awareness checks.  Avoid chewing and smoking tobacco. Chemicals from cigarettes that pass into breast milk and exposure to secondhand smoke may harm your baby.  Avoid alcohol and drug use, including marijuana. Some medicines that may be harmful to your baby can pass through breast milk. It is important to ask your health care   provider before taking any medicine, including all over-the-counter and prescription medicine as well as vitamin and herbal supplements. It is possible to become pregnant while breastfeeding. If birth control is desired, ask your health care provider about options that will be safe for your baby. Contact a health care provider if:  You feel like you want to stop breastfeeding or have become frustrated with breastfeeding.  You have painful breasts or nipples.  Your nipples are cracked or bleeding.  Your breasts are red, tender, or warm.  You have a swollen area on either breast.  You have a fever or chills.  You have nausea or vomiting.  You have drainage other than breast milk from your nipples.  Your breasts do not become full before feedings by the fifth day after you give birth.  You feel sad and depressed.  Your baby is too sleepy to eat well.  Your baby is having trouble sleeping.  Your baby is wetting less than 3 diapers in a 24-hour period.  Your baby has less than 3 stools in a 24-hour period.  Your baby's skin or the white part of his or her eyes becomes yellow.  Your baby is not gaining weight by 5 days of age. Get help right away  if:  Your baby is overly tired (lethargic) and does not want to wake up and feed.  Your baby develops an unexplained fever. This information is not intended to replace advice given to you by your health care provider. Make sure you discuss any questions you have with your health care provider. Document Released: 06/23/2005 Document Revised: 12/05/2015 Document Reviewed: 12/15/2012 Elsevier Interactive Patient Education  2017 [employer]  

## 2017-04-27 ENCOUNTER — Encounter: Payer: Self-pay | Admitting: Family Medicine

## 2017-04-27 DIAGNOSIS — O99820 Streptococcus B carrier state complicating pregnancy: Secondary | ICD-10-CM | POA: Insufficient documentation

## 2017-04-27 LAB — GC/CHLAMYDIA PROBE AMP (~~LOC~~) NOT AT ARMC
CHLAMYDIA, DNA PROBE: NEGATIVE
NEISSERIA GONORRHEA: NEGATIVE

## 2017-04-27 LAB — CULTURE, BETA STREP (GROUP B ONLY): STREP GP B CULTURE: POSITIVE — AB

## 2017-04-28 ENCOUNTER — Ambulatory Visit (HOSPITAL_COMMUNITY)
Admission: RE | Admit: 2017-04-28 | Discharge: 2017-04-28 | Disposition: A | Discharge status: Home / Self Care | Payer: BLUE CROSS/BLUE SHIELD | Source: Ambulatory Visit | Attending: Family Medicine | Admitting: Family Medicine

## 2017-04-28 ENCOUNTER — Other Ambulatory Visit: Payer: Self-pay | Admitting: Advanced Practice Midwife

## 2017-04-28 ENCOUNTER — Other Ambulatory Visit (HOSPITAL_COMMUNITY): Payer: Self-pay | Admitting: Maternal and Fetal Medicine

## 2017-04-28 ENCOUNTER — Encounter (HOSPITAL_COMMUNITY): Payer: Self-pay

## 2017-04-28 DIAGNOSIS — Z3A36 36 weeks gestation of pregnancy: Secondary | ICD-10-CM | POA: Insufficient documentation

## 2017-04-28 DIAGNOSIS — O99213 Obesity complicating pregnancy, third trimester: Secondary | ICD-10-CM

## 2017-04-28 DIAGNOSIS — O24415 Gestational diabetes mellitus in pregnancy, controlled by oral hypoglycemic drugs: Secondary | ICD-10-CM | POA: Insufficient documentation

## 2017-04-28 DIAGNOSIS — O403XX0 Polyhydramnios, third trimester, not applicable or unspecified: Secondary | ICD-10-CM | POA: Insufficient documentation

## 2017-04-28 DIAGNOSIS — O34219 Maternal care for unspecified type scar from previous cesarean delivery: Secondary | ICD-10-CM | POA: Insufficient documentation

## 2017-04-28 DIAGNOSIS — O3663X0 Maternal care for excessive fetal growth, third trimester, not applicable or unspecified: Secondary | ICD-10-CM

## 2017-04-28 DIAGNOSIS — O99820 Streptococcus B carrier state complicating pregnancy: Secondary | ICD-10-CM

## 2017-04-28 DIAGNOSIS — O358XX0 Maternal care for other (suspected) fetal abnormality and damage, not applicable or unspecified: Secondary | ICD-10-CM

## 2017-04-29 ENCOUNTER — Encounter (HOSPITAL_COMMUNITY): Payer: Self-pay | Admitting: Anesthesiology

## 2017-04-29 ENCOUNTER — Encounter (HOSPITAL_COMMUNITY)
Admission: RE | Disposition: A | Discharge status: Home / Self Care | Payer: Self-pay | Source: Ambulatory Visit | Attending: Family Medicine

## 2017-04-29 ENCOUNTER — Inpatient Hospital Stay (HOSPITAL_COMMUNITY)
Admission: RE | Admit: 2017-04-29 | Discharge: 2017-05-02 | DRG: 788 | Disposition: A | Discharge status: Home / Self Care | Payer: BLUE CROSS/BLUE SHIELD | Source: Ambulatory Visit | Attending: Family Medicine | Admitting: Family Medicine

## 2017-04-29 ENCOUNTER — Encounter (HOSPITAL_COMMUNITY): Payer: Self-pay

## 2017-04-29 ENCOUNTER — Inpatient Hospital Stay (HOSPITAL_COMMUNITY): Payer: BLUE CROSS/BLUE SHIELD | Admitting: Anesthesiology

## 2017-04-29 VITALS — BP 118/67 | HR 83 | Temp 97.6°F | Resp 18 | Ht 67.0 in | Wt 291.0 lb

## 2017-04-29 DIAGNOSIS — O3660X0 Maternal care for excessive fetal growth, unspecified trimester, not applicable or unspecified: Secondary | ICD-10-CM

## 2017-04-29 DIAGNOSIS — Z6841 Body Mass Index (BMI) 40.0 and over, adult: Secondary | ICD-10-CM

## 2017-04-29 DIAGNOSIS — Z3A37 37 weeks gestation of pregnancy: Secondary | ICD-10-CM

## 2017-04-29 DIAGNOSIS — O24425 Gestational diabetes mellitus in childbirth, controlled by oral hypoglycemic drugs: Secondary | ICD-10-CM | POA: Diagnosis present

## 2017-04-29 DIAGNOSIS — O99214 Obesity complicating childbirth: Secondary | ICD-10-CM | POA: Diagnosis present

## 2017-04-29 DIAGNOSIS — O99820 Streptococcus B carrier state complicating pregnancy: Secondary | ICD-10-CM

## 2017-04-29 DIAGNOSIS — O99824 Streptococcus B carrier state complicating childbirth: Secondary | ICD-10-CM | POA: Diagnosis present

## 2017-04-29 DIAGNOSIS — O358XX0 Maternal care for other (suspected) fetal abnormality and damage, not applicable or unspecified: Secondary | ICD-10-CM | POA: Diagnosis present

## 2017-04-29 DIAGNOSIS — O34211 Maternal care for low transverse scar from previous cesarean delivery: Secondary | ICD-10-CM | POA: Diagnosis present

## 2017-04-29 DIAGNOSIS — O99210 Obesity complicating pregnancy, unspecified trimester: Secondary | ICD-10-CM

## 2017-04-29 DIAGNOSIS — O24429 Gestational diabetes mellitus in childbirth, unspecified control: Secondary | ICD-10-CM

## 2017-04-29 DIAGNOSIS — O3663X0 Maternal care for excessive fetal growth, third trimester, not applicable or unspecified: Secondary | ICD-10-CM | POA: Diagnosis present

## 2017-04-29 DIAGNOSIS — O0993 Supervision of high risk pregnancy, unspecified, third trimester: Secondary | ICD-10-CM

## 2017-04-29 DIAGNOSIS — IMO0002 Reserved for concepts with insufficient information to code with codable children: Secondary | ICD-10-CM | POA: Diagnosis present

## 2017-04-29 DIAGNOSIS — O283 Abnormal ultrasonic finding on antenatal screening of mother: Secondary | ICD-10-CM | POA: Diagnosis present

## 2017-04-29 DIAGNOSIS — O34219 Maternal care for unspecified type scar from previous cesarean delivery: Secondary | ICD-10-CM

## 2017-04-29 DIAGNOSIS — O24410 Gestational diabetes mellitus in pregnancy, diet controlled: Secondary | ICD-10-CM

## 2017-04-29 DIAGNOSIS — Z98891 History of uterine scar from previous surgery: Secondary | ICD-10-CM

## 2017-04-29 DIAGNOSIS — O403XX0 Polyhydramnios, third trimester, not applicable or unspecified: Secondary | ICD-10-CM | POA: Diagnosis not present

## 2017-04-29 DIAGNOSIS — Z8632 Personal history of gestational diabetes: Secondary | ICD-10-CM | POA: Diagnosis present

## 2017-04-29 LAB — CBC
HCT: 35.6 % — ABNORMAL LOW (ref 36.0–46.0)
HEMOGLOBIN: 12.1 g/dL (ref 12.0–15.0)
MCH: 30.1 pg (ref 26.0–34.0)
MCHC: 34 g/dL (ref 30.0–36.0)
MCV: 88.6 fL (ref 78.0–100.0)
Platelets: 278 10*3/uL (ref 150–400)
RBC: 4.02 MIL/uL (ref 3.87–5.11)
RDW: 14.7 % (ref 11.5–15.5)
WBC: 6.3 10*3/uL (ref 4.0–10.5)

## 2017-04-29 LAB — RPR: RPR: NONREACTIVE

## 2017-04-29 LAB — GLUCOSE, CAPILLARY
GLUCOSE-CAPILLARY: 80 mg/dL (ref 65–99)
Glucose-Capillary: 133 mg/dL — ABNORMAL HIGH (ref 65–99)
Glucose-Capillary: 61 mg/dL — ABNORMAL LOW (ref 65–99)

## 2017-04-29 LAB — TYPE AND SCREEN
ABO/RH(D): O POS
Antibody Screen: NEGATIVE

## 2017-04-29 LAB — ABO/RH: ABO/RH(D): O POS

## 2017-04-29 SURGERY — Surgical Case

## 2017-04-29 SURGERY — Surgical Case
Anesthesia: Spinal | Wound class: Clean Contaminated

## 2017-04-29 MED ORDER — KETOROLAC TROMETHAMINE 30 MG/ML IJ SOLN
30.0000 mg | Freq: Once | INTRAMUSCULAR | Status: DC | PRN
  Administered 2017-04-29: 30 mg via INTRAVENOUS

## 2017-04-29 MED ORDER — NALOXONE HCL 2 MG/2ML IJ SOSY
1.0000 ug/kg/h | PREFILLED_SYRINGE | INTRAVENOUS | Status: DC | PRN
  Filled 2017-04-29: qty 2

## 2017-04-29 MED ORDER — ONDANSETRON HCL 4 MG/2ML IJ SOLN: 4.0000 mg | Freq: Three times a day (TID) | INTRAMUSCULAR | Status: DC | PRN

## 2017-04-29 MED ORDER — NALBUPHINE HCL 10 MG/ML IJ SOLN: 5.0000 mg | Freq: Once | INTRAMUSCULAR | Status: DC | PRN

## 2017-04-29 MED ORDER — SIMETHICONE 80 MG PO CHEW
80.0000 mg | CHEWABLE_TABLET | ORAL | Status: DC | PRN
  Filled 2017-04-29: qty 1

## 2017-04-29 MED ORDER — NALBUPHINE HCL 10 MG/ML IJ SOLN: 5.0000 mg | INTRAMUSCULAR | Status: DC | PRN

## 2017-04-29 MED ORDER — TETANUS-DIPHTH-ACELL PERTUSSIS 5-2.5-18.5 LF-MCG/0.5 IM SUSP
0.5000 mL | Freq: Once | INTRAMUSCULAR | Status: DC
  Filled 2017-04-29: qty 0.5

## 2017-04-29 MED ORDER — SODIUM CHLORIDE 0.9 % IR SOLN
Status: DC | PRN
  Administered 2017-04-29: 1

## 2017-04-29 MED ORDER — DIPHENHYDRAMINE HCL 25 MG PO CAPS
25.0000 mg | ORAL_CAPSULE | Freq: Four times a day (QID) | ORAL | Status: DC | PRN
  Administered 2017-04-30: 25 mg via ORAL
  Filled 2017-04-29 (×2): qty 1

## 2017-04-29 MED ORDER — OXYCODONE-ACETAMINOPHEN 5-325 MG PO TABS: 2.0000 | ORAL_TABLET | ORAL | Status: DC | PRN

## 2017-04-29 MED ORDER — LIDOCAINE HCL (PF) 1 % IJ SOLN: 30.0000 mL | INTRAMUSCULAR | Status: DC | PRN

## 2017-04-29 MED ORDER — PROMETHAZINE HCL 25 MG/ML IJ SOLN: 6.2500 mg | INTRAMUSCULAR | Status: DC | PRN

## 2017-04-29 MED ORDER — DIPHENHYDRAMINE HCL 50 MG/ML IJ SOLN: 12.5000 mg | INTRAMUSCULAR | Status: DC | PRN

## 2017-04-29 MED ORDER — BUPIVACAINE IN DEXTROSE 0.75-8.25 % IT SOLN
INTRATHECAL | Status: DC | PRN
  Administered 2017-04-29: 1.5 mL via INTRATHECAL

## 2017-04-29 MED ORDER — OXYTOCIN 40 UNITS IN LACTATED RINGERS INFUSION - SIMPLE MED: 2.5000 [IU]/h | INTRAVENOUS | Status: AC

## 2017-04-29 MED ORDER — ACETAMINOPHEN 325 MG PO TABS
650.0000 mg | ORAL_TABLET | ORAL | Status: DC | PRN
  Administered 2017-04-30 – 2017-05-02 (×7): 650 mg via ORAL
  Filled 2017-04-29 (×7): qty 2

## 2017-04-29 MED ORDER — KETOROLAC TROMETHAMINE 30 MG/ML IJ SOLN
INTRAMUSCULAR | Status: AC
  Filled 2017-04-29: qty 1

## 2017-04-29 MED ORDER — FENTANYL CITRATE (PF) 100 MCG/2ML IJ SOLN
INTRAMUSCULAR | Status: AC
  Filled 2017-04-29: qty 2

## 2017-04-29 MED ORDER — FENTANYL CITRATE (PF) 100 MCG/2ML IJ SOLN
INTRAMUSCULAR | Status: DC | PRN
  Administered 2017-04-29 (×2): 100 ug via INTRAVENOUS

## 2017-04-29 MED ORDER — ACETAMINOPHEN 325 MG PO TABS: 650.0000 mg | ORAL_TABLET | ORAL | Status: DC | PRN

## 2017-04-29 MED ORDER — OXYCODONE HCL 5 MG PO TABS: 5.0000 mg | ORAL_TABLET | Freq: Once | ORAL | Status: DC | PRN

## 2017-04-29 MED ORDER — PHENYLEPHRINE 8 MG IN D5W 100 ML (0.08MG/ML) PREMIX OPTIME
INJECTION | INTRAVENOUS | Status: DC | PRN
  Administered 2017-04-29: 60 ug/min via INTRAVENOUS

## 2017-04-29 MED ORDER — ENOXAPARIN SODIUM 40 MG/0.4ML ~~LOC~~ SOLN
40.0000 mg | SUBCUTANEOUS | Status: DC
  Filled 2017-04-29: qty 0.4

## 2017-04-29 MED ORDER — FENTANYL CITRATE (PF) 100 MCG/2ML IJ SOLN: 100.0000 ug | INTRAMUSCULAR | Status: DC | PRN

## 2017-04-29 MED ORDER — MEPERIDINE HCL 25 MG/ML IJ SOLN: 6.2500 mg | INTRAMUSCULAR | Status: DC | PRN

## 2017-04-29 MED ORDER — HYDROMORPHONE HCL 1 MG/ML IJ SOLN: 0.2500 mg | INTRAMUSCULAR | Status: DC | PRN

## 2017-04-29 MED ORDER — DIPHENHYDRAMINE HCL 25 MG PO CAPS
25.0000 mg | ORAL_CAPSULE | ORAL | Status: DC | PRN
  Administered 2017-04-29 – 2017-04-30 (×2): 25 mg via ORAL
  Filled 2017-04-29: qty 1

## 2017-04-29 MED ORDER — OXYTOCIN BOLUS FROM INFUSION: 500.0000 mL | Freq: Once | INTRAVENOUS | Status: DC

## 2017-04-29 MED ORDER — LACTATED RINGERS IV SOLN
INTRAVENOUS | Status: DC | PRN
  Administered 2017-04-29 (×2): via INTRAVENOUS

## 2017-04-29 MED ORDER — HYDROMORPHONE HCL 1 MG/ML IJ SOLN
INTRAMUSCULAR | Status: AC
  Filled 2017-04-29: qty 0.5

## 2017-04-29 MED ORDER — HYDROMORPHONE HCL 1 MG/ML IJ SOLN
0.2500 mg | INTRAMUSCULAR | Status: DC | PRN
  Administered 2017-04-29: 0.5 mg via INTRAVENOUS

## 2017-04-29 MED ORDER — KETOROLAC TROMETHAMINE 30 MG/ML IJ SOLN: 30.0000 mg | Freq: Once | INTRAMUSCULAR | Status: DC | PRN

## 2017-04-29 MED ORDER — DIBUCAINE 1 % RE OINT
1.0000 "application " | TOPICAL_OINTMENT | RECTAL | Status: DC | PRN
  Filled 2017-04-29: qty 28

## 2017-04-29 MED ORDER — OXYCODONE HCL 5 MG/5ML PO SOLN: 5.0000 mg | Freq: Once | ORAL | Status: DC | PRN

## 2017-04-29 MED ORDER — IBUPROFEN 600 MG PO TABS
600.0000 mg | ORAL_TABLET | Freq: Four times a day (QID) | ORAL | Status: DC
  Administered 2017-04-30 – 2017-05-02 (×11): 600 mg via ORAL
  Filled 2017-04-29 (×11): qty 1

## 2017-04-29 MED ORDER — ZOLPIDEM TARTRATE 5 MG PO TABS: 5.0000 mg | ORAL_TABLET | Freq: Every evening | ORAL | Status: DC | PRN

## 2017-04-29 MED ORDER — OXYCODONE-ACETAMINOPHEN 5-325 MG PO TABS: 1.0000 | ORAL_TABLET | ORAL | Status: DC | PRN

## 2017-04-29 MED ORDER — SENNOSIDES-DOCUSATE SODIUM 8.6-50 MG PO TABS
2.0000 | ORAL_TABLET | ORAL | Status: DC
  Administered 2017-04-30 – 2017-05-01 (×3): 2 via ORAL
  Filled 2017-04-29 (×5): qty 2

## 2017-04-29 MED ORDER — LACTATED RINGERS IV SOLN: 500.0000 mL | INTRAVENOUS | Status: DC | PRN

## 2017-04-29 MED ORDER — ONDANSETRON HCL 4 MG/2ML IJ SOLN
INTRAMUSCULAR | Status: DC | PRN
  Administered 2017-04-29: 4 mg via INTRAVENOUS

## 2017-04-29 MED ORDER — SOD CITRATE-CITRIC ACID 500-334 MG/5ML PO SOLN: 30.0000 mL | ORAL | Status: DC | PRN

## 2017-04-29 MED ORDER — PHENYLEPHRINE 8 MG IN D5W 100 ML (0.08MG/ML) PREMIX OPTIME
INJECTION | INTRAVENOUS | Status: AC
  Filled 2017-04-29: qty 100

## 2017-04-29 MED ORDER — SIMETHICONE 80 MG PO CHEW
80.0000 mg | CHEWABLE_TABLET | ORAL | Status: DC
  Administered 2017-04-30 – 2017-05-01 (×3): 80 mg via ORAL
  Filled 2017-04-29 (×5): qty 1

## 2017-04-29 MED ORDER — OXYTOCIN 40 UNITS IN LACTATED RINGERS INFUSION - SIMPLE MED: 2.5000 [IU]/h | INTRAVENOUS | Status: DC

## 2017-04-29 MED ORDER — PENICILLIN G POT IN DEXTROSE 60000 UNIT/ML IV SOLN
3.0000 10*6.[IU] | INTRAVENOUS | Status: DC
  Filled 2017-04-29 (×7): qty 50

## 2017-04-29 MED ORDER — SIMETHICONE 80 MG PO CHEW
80.0000 mg | CHEWABLE_TABLET | Freq: Three times a day (TID) | ORAL | Status: DC
  Administered 2017-04-30 – 2017-05-02 (×7): 80 mg via ORAL
  Filled 2017-04-29 (×11): qty 1

## 2017-04-29 MED ORDER — OXYTOCIN 10 UNIT/ML IJ SOLN
INTRAVENOUS | Status: DC | PRN
  Administered 2017-04-29: 40 [IU] via INTRAVENOUS

## 2017-04-29 MED ORDER — NALOXONE HCL 0.4 MG/ML IJ SOLN: 0.4000 mg | INTRAMUSCULAR | Status: DC | PRN

## 2017-04-29 MED ORDER — MENTHOL 3 MG MT LOZG
1.0000 | LOZENGE | OROMUCOSAL | Status: DC | PRN
  Filled 2017-04-29: qty 9

## 2017-04-29 MED ORDER — SOD CITRATE-CITRIC ACID 500-334 MG/5ML PO SOLN
30.0000 mL | ORAL | Status: AC
  Administered 2017-04-29: 30 mL via ORAL
  Filled 2017-04-29: qty 15

## 2017-04-29 MED ORDER — DEXTROSE 5 % IN LACTATED RINGERS IV BOLUS
250.0000 mL | Freq: Once | INTRAVENOUS | Status: AC
  Administered 2017-04-29: 250 mL via INTRAVENOUS

## 2017-04-29 MED ORDER — IBUPROFEN 600 MG PO TABS: 600.0000 mg | ORAL_TABLET | Freq: Four times a day (QID) | ORAL | Status: DC

## 2017-04-29 MED ORDER — SCOPOLAMINE 1 MG/3DAYS TD PT72: 1.0000 | MEDICATED_PATCH | Freq: Once | TRANSDERMAL | Status: DC

## 2017-04-29 MED ORDER — OXYTOCIN 10 UNIT/ML IJ SOLN
INTRAMUSCULAR | Status: AC
  Filled 2017-04-29: qty 4

## 2017-04-29 MED ORDER — BUPIVACAINE IN DEXTROSE 0.75-8.25 % IT SOLN
INTRATHECAL | Status: AC
  Filled 2017-04-29: qty 2

## 2017-04-29 MED ORDER — LACTATED RINGERS IV SOLN
INTRAVENOUS | Status: DC
  Administered 2017-04-29 – 2017-04-30 (×2): via INTRAVENOUS

## 2017-04-29 MED ORDER — LACTATED RINGERS IV SOLN
INTRAVENOUS | Status: DC | PRN
  Administered 2017-04-29: 15:00:00 via INTRAVENOUS

## 2017-04-29 MED ORDER — MORPHINE SULFATE (PF) 0.5 MG/ML IJ SOLN
INTRAMUSCULAR | Status: AC
  Filled 2017-04-29: qty 10

## 2017-04-29 MED ORDER — SODIUM CHLORIDE 0.9% FLUSH: 3.0000 mL | INTRAVENOUS | Status: DC | PRN

## 2017-04-29 MED ORDER — NALOXONE HCL 2 MG/2ML IJ SOSY
1.0000 ug/kg/h | PREFILLED_SYRINGE | INTRAMUSCULAR | Status: DC | PRN
  Filled 2017-04-29: qty 2

## 2017-04-29 MED ORDER — DIPHENHYDRAMINE HCL 25 MG PO CAPS
25.0000 mg | ORAL_CAPSULE | ORAL | Status: DC | PRN
  Filled 2017-04-29 (×3): qty 1

## 2017-04-29 MED ORDER — COCONUT OIL OIL
1.0000 "application " | TOPICAL_OIL | Status: DC | PRN
  Filled 2017-04-29: qty 120

## 2017-04-29 MED ORDER — MEPERIDINE HCL 25 MG/ML IJ SOLN
6.2500 mg | INTRAMUSCULAR | Status: DC | PRN
Start: 2017-04-29 — End: 2017-04-29

## 2017-04-29 MED ORDER — PENICILLIN G POTASSIUM 5000000 UNITS IJ SOLR
5.0000 10*6.[IU] | Freq: Once | INTRAVENOUS | Status: DC
  Filled 2017-04-29: qty 5

## 2017-04-29 MED ORDER — SCOPOLAMINE 1 MG/3DAYS TD PT72
MEDICATED_PATCH | TRANSDERMAL | Status: AC
  Filled 2017-04-29: qty 1

## 2017-04-29 MED ORDER — DEXTROSE 5 % IV SOLN
3.0000 g | INTRAVENOUS | Status: AC
  Administered 2017-04-29: 3 g via INTRAVENOUS
  Filled 2017-04-29: qty 3

## 2017-04-29 MED ORDER — MORPHINE SULFATE (PF) 0.5 MG/ML IJ SOLN
INTRAMUSCULAR | Status: DC | PRN
  Administered 2017-04-29: .2 mg via EPIDURAL

## 2017-04-29 MED ORDER — ONDANSETRON HCL 4 MG/2ML IJ SOLN
INTRAMUSCULAR | Status: AC
  Filled 2017-04-29: qty 2

## 2017-04-29 MED ORDER — ONDANSETRON HCL 4 MG/2ML IJ SOLN: 4.0000 mg | Freq: Four times a day (QID) | INTRAMUSCULAR | Status: DC | PRN

## 2017-04-29 MED ORDER — PRENATAL MULTIVITAMIN CH
1.0000 | ORAL_TABLET | Freq: Every day | ORAL | Status: DC
  Administered 2017-04-30 – 2017-05-02 (×3): 1 via ORAL
  Filled 2017-04-29 (×4): qty 1

## 2017-04-29 MED ORDER — LACTATED RINGERS IV SOLN
INTRAVENOUS | Status: DC
  Administered 2017-04-29: 125 mL via INTRAVENOUS

## 2017-04-29 MED ORDER — WITCH HAZEL-GLYCERIN EX PADS: 1.0000 "application " | MEDICATED_PAD | CUTANEOUS | Status: DC | PRN

## 2017-04-29 SURGICAL SUPPLY — 29 items
BENZOIN TINCTURE PRP APPL 2/3 (GAUZE/BANDAGES/DRESSINGS) ×2 IMPLANT
CHLORAPREP W/TINT 26ML (MISCELLANEOUS) ×2 IMPLANT
CLOTH BEACON ORANGE TIMEOUT ST (SAFETY) ×2 IMPLANT
DRESSING DISP NPWT PICO 4X12 (MISCELLANEOUS) ×2 IMPLANT
DRSG OPSITE POSTOP 4X10 (GAUZE/BANDAGES/DRESSINGS) ×2 IMPLANT
ELECT REM PT RETURN 9FT ADLT (ELECTROSURGICAL) ×2
ELECTRODE REM PT RTRN 9FT ADLT (ELECTROSURGICAL) ×1 IMPLANT
EXTENDER TRAXI PANNICULUS (MISCELLANEOUS) ×1 IMPLANT
GLOVE BIOGEL PI IND STRL 7.0 (GLOVE) ×3 IMPLANT
GLOVE BIOGEL PI INDICATOR 7.0 (GLOVE) ×3
GLOVE ECLIPSE 6.5 STRL STRAW (GLOVE) ×2 IMPLANT
GOWN STRL REUS W/ TWL LRG LVL3 (GOWN DISPOSABLE) ×2 IMPLANT
GOWN STRL REUS W/TWL LRG LVL3 (GOWN DISPOSABLE) ×2
NEEDLE HYPO 22GX1.5 SAFETY (NEEDLE) ×2 IMPLANT
NS IRRIG 1000ML POUR BTL (IV SOLUTION) ×2 IMPLANT
PAD OB MATERNITY 4.3X12.25 (PERSONAL CARE ITEMS) ×2 IMPLANT
PAD PREP 24X48 CUFFED NSTRL (MISCELLANEOUS) ×2 IMPLANT
RETRACTOR WND ALEXIS 25 LRG (MISCELLANEOUS) IMPLANT
RTRCTR WOUND ALEXIS 25CM LRG (MISCELLANEOUS)
STRIP CLOSURE SKIN 1/2X4 (GAUZE/BANDAGES/DRESSINGS) ×2 IMPLANT
SUT PLAIN 2 0 XLH (SUTURE) ×2 IMPLANT
SUT VIC AB 0 CT1 36 (SUTURE) ×6 IMPLANT
SUT VIC AB 2-0 CT1 27 (SUTURE) ×1
SUT VIC AB 2-0 CT1 TAPERPNT 27 (SUTURE) ×1 IMPLANT
SUT VIC AB 4-0 KS 27 (SUTURE) ×2 IMPLANT
SYR CONTROL 10ML LL (SYRINGE) ×2 IMPLANT
TOWEL OR 17X24 6PK STRL BLUE (TOWEL DISPOSABLE) ×6 IMPLANT
TRAXI PANNICULUS EXTENDER (MISCELLANEOUS) ×1
TRAY FOLEY CATH SILVER 16FR (SET/KITS/TRAYS/PACK) ×2 IMPLANT

## 2017-04-29 NOTE — Progress Notes (Signed)
[name] is a 31 y.o. [id number] at 2285w0d pregnancy complicated by A2GDM, h/o CSx1 with VBACx2. EFW 4898g on 10/23. Patient with proven pelvis to 9lb9oz. Dr. Adrian BlackwaterStinson discussed repeat  CS with the patient and she opted for repeat CS given risks of vaginal birth.   The risks of cesarean section were discussed with the patient including but were not limited to: bleeding which may require transfusion or reoperation; infection which may require antibiotics; injury to bowel, bladder, ureters or other surrounding organs; injury to the fetus; need for additional procedures including hysterectomy in the event of a life-threatening hemorrhage; placental abnormalities wth subsequent pregnancies, incisional problems, thromboembolic phenomenon and other postoperative/anesthesia complications.  Patient has been NPO since 0600 she will remain NPO for procedure. Anesthesia and OR aware.  Preoperative prophylactic antibiotics and SCDs ordered on call to the OR.  To OR for scheduled CS at 1400.   Federico FlakeKimberly Niles Warren Lindahl, MD, MPH, ABFM Attending Physician Faculty Practice- [facility]

## 2017-04-29 NOTE — Lactation Note (Signed)
This note was copied from a baby's chart. Lactation Consultation Note  Patient Name: [name]: 04/29/2017 Reason for consult: Initial assessment Baby at 6 hr of life. Upon entry there were multiple visitors and mom was laying flat in the bed with her eyes closed. Left lactation handouts and instructions to call for staff at the next bf.   Consult Status Consult Status: Follow-up [name]: 04/30/17 Follow-up type: In-patient    Rulon Eisenmengerlizabeth E Rafeef Lau 04/29/2017, 8:57 PM

## 2017-04-29 NOTE — Progress Notes (Signed)
Went in to do the one hour check. Patient's PICO dressing was actively oozing out of both sides of dressing and was beginning to get saturated. Resident Loclymear and resident Raynelle FanningJulie Degele called to room to evaluate. PICO dressing changed by them new one applied since the dressing was not suctioning properly. PICO dressing reinforced by Nolene EbbsJulie Degele. Will monitor.

## 2017-04-29 NOTE — Op Note (Signed)
Cesarean Section Operative Report  [name]  PROCEDURE DATE: 04/29/2017  PREOPERATIVE DIAGNOSES: Intrauterine pregnancy at [redacted]w[redacted]d weeks gestation; suspected macrosomia; prior Cesarean Section  POSTOPERATIVE DIAGNOSES: The same  PROCEDURE: Repeat Low Transverse Cesarean Section  SURGEON:   Surgeon(s) and Role:    * Federico Flake, MD - Primary    * Andersson Larrabee, Kandra Nicolas, MD - Fellow   ASSISTANT:  Nolene Ebbs, MD - OB Fellow   INDICATIONS: [name] is a 31 y.o. [phone] at [redacted]w[redacted]d here for cesarean section secondary to the indications listed under preoperative diagnoses; please see preoperative note for further details.  The risks of cesarean section were discussed with the patient including but were not limited to: bleeding which may require transfusion or reoperation; infection which may require antibiotics; injury to bowel, bladder, ureters or other surrounding organs; injury to the fetus; need for additional procedures including hysterectomy in the event of a life-threatening hemorrhage; placental abnormalities wth subsequent pregnancies, incisional problems, thromboembolic phenomenon and other postoperative/anesthesia complications.   The patient concurred with the proposed plan, giving informed written consent for the procedure.    FINDINGS:  Viable female infant in cephalic presentation.  Apgars 8 and 8.  Clear amniotic fluid.  Intact placenta, three vessel cord.  Normal uterus, fallopian tubes and ovaries bilaterally. Omental adhesions.  ANESTHESIA: Spinal INTRAVENOUS FLUIDS: 2400 ml ESTIMATED BLOOD LOSS: 800 mL URINE OUTPUT:  100 ml SPECIMENS: Placenta sent to L&D COMPLICATIONS: None immediate  PROCEDURE IN DETAIL:  The patient preoperatively received intravenous antibiotics and had sequential compression devices applied to her lower extremities.  She was then taken to the operating room where spinal anesthesia was administered and was found to be adequate. She  was then placed in a dorsal supine position with a leftward tilt, and prepped and draped in a sterile manner.  A foley catheter was placed into her bladder and attached to constant gravity.    After an adequate timeout was performed, a Pfannenstiel skin incision was made with scalpel over her preexisting scar and carried through to the underlying layer of fascia. The fascia was incised in the midline, and this incision was extended bilaterally using the Mayo scissors.  Kocher clamps were applied to the superior aspect of the fascial incision and the underlying rectus muscles were dissected off bluntly.  A similar process was carried out on the inferior aspect of the fascial incision. The rectus muscles were separated in the midline bluntly and the peritoneum was entered bluntly. Alexis retractor was placed. A bladder flap was created with Metzenbaum scissors. Attention was turned to the lower uterine segment where a low transverse hysterotomy was made with a scalpel and extended bilaterally bluntly.  The infant was successfully delivered, the cord was clamped and cut after one minute, and the infant was handed over to the awaiting neonatology team. Uterine massage was then administered, and the placenta delivered intact with a three-vessel cord. The uterus was then cleared of clots and debris.  The hysterotomy was closed with 0 Vicryl in a running locked fashion, and an imbricating layer was also placed with 0 Vicryl.  Figure-of-eight 0 Vicryl serosal stitches were placed to help with hemostasis.  The pelvis was cleared of all clot and debris. Hemostasis was confirmed on all surfaces.  The rectus muscles were reapproximated using 2-0 Vicryl interrupted stitches. The fascia was then closed using 0 Vicryl.  The subcutaneous layer was reapproximated with 2-0 plain gut interrupted stitches.  The skin was closed with a 4-0 Vicryl  subcuticular stitch. PICO dressing was placed over incision.  The patient tolerated the  procedure well. Sponge, lap, instrument and needle counts were correct x 3.  She was taken to the recovery room in stable condition.   Disposition: PACU - hemodynamically stable.   Maternal Condition: stable    Signed: Frederik PearJulie P Marcelle Hepner, MD OB Fellow 04/29/2017 4:02 PM

## 2017-04-29 NOTE — Anesthesia Preprocedure Evaluation (Signed)
Anesthesia Evaluation  Patient identified by MRN, date of birth, ID band Patient awake    Reviewed: Allergy & Precautions, NPO status , Patient's Chart, lab work & pertinent test results  Airway Mallampati: III  TM Distance: >3 FB Neck ROM: Full    Dental no notable dental hx.    Pulmonary neg pulmonary ROS,    Pulmonary exam normal breath sounds clear to auscultation       Cardiovascular negative cardio ROS Normal cardiovascular exam Rhythm:Regular Rate:Normal     Neuro/Psych negative neurological ROS  negative psych ROS   GI/Hepatic negative GI ROS, Neg liver ROS,   Endo/Other  diabetes, Poorly ControlledMorbid obesity  Renal/GU negative Renal ROS     Musculoskeletal negative musculoskeletal ROS (+)   Abdominal (+) + obese,   Peds  Hematology negative hematology ROS (+)   Anesthesia Other Findings   Reproductive/Obstetrics (+) Pregnancy                                                             Anesthesia Evaluation  Patient identified by MRN, date of birth, ID band Patient awake    Reviewed: Allergy & Precautions, H&P , NPO status , Patient's Chart, lab work & pertinent test results, reviewed documented beta blocker date and time   History of Anesthesia Complications Negative for: history of anesthetic complications  Airway Mallampati: II TM Distance: >3 FB Neck ROM: full    Dental  (+) Teeth Intact   Pulmonary neg pulmonary ROS,  clear to auscultation        Cardiovascular neg cardio ROS regular Normal    Neuro/Psych Negative Neurological ROS  Negative Psych ROS   GI/Hepatic negative GI ROS, Neg liver ROS,   Endo/Other  Morbid obesity  Renal/GU negative Renal ROS     Musculoskeletal   Abdominal   Peds  Hematology negative hematology ROS (+)   Anesthesia Other Findings   Reproductive/Obstetrics (+) Pregnancy                           Anesthesia Physical Anesthesia Plan  ASA: II  Anesthesia Plan: Epidural   Post-op Pain Management:    Induction:   Airway Management Planned:   Additional Equipment:   Intra-op Plan:   Post-operative Plan:   Informed Consent: I have reviewed the patients History and Physical, chart, labs and discussed the procedure including the risks, benefits and alternatives for the proposed anesthesia with the patient or authorized representative who has indicated his/her understanding and acceptance.     Plan Discussed with:   Anesthesia Plan Comments:         Anesthesia Quick Evaluation  Anesthesia Physical Anesthesia Plan  ASA: III  Anesthesia Plan: Spinal   Post-op Pain Management:    Induction: Intravenous  PONV Risk Score and Plan: 4 or greater and Ondansetron, Treatment may vary due to age or medical condition and Scopolamine patch - Pre-op  Airway Management Planned:   Additional Equipment:   Intra-op Plan:   Post-operative Plan:   Informed Consent: I have reviewed the patients History and Physical, chart, labs and discussed the procedure including the risks, benefits and alternatives for the proposed anesthesia with the patient or authorized representative who has indicated his/her understanding and acceptance.   Dental  advisory given  Plan Discussed with: CRNA  Anesthesia Plan Comments:         Anesthesia Quick Evaluation

## 2017-04-29 NOTE — Progress Notes (Signed)
Call bell, bulb suction, Edinburgh scale, meals , pain meds and plan of care explained to mother. Safety and security explained to patient but she was nodding off so she did not sign papers yet. Patient told not to get up without nurse.

## 2017-04-29 NOTE — Anesthesia Procedure Notes (Signed)
Spinal  Patient location during procedure: OR Staffing Anesthesiologist: Nolon Nations Performed: anesthesiologist  Preanesthetic Checklist Completed: patient identified, site marked, surgical consent, pre-op evaluation, timeout performed, IV checked, risks and benefits discussed and monitors and equipment checked Spinal Block Patient position: sitting Prep: site prepped and draped and DuraPrep Patient monitoring: heart rate, continuous pulse ox and blood pressure Approach: midline Location: L3-4 Injection technique: single-shot Needle Needle type: Sprotte  Needle gauge: 24 G Needle length: 9 cm Assessment Sensory level: T6 Additional Notes Expiration date of kit checked and confirmed. Patient tolerated procedure well, without complications.

## 2017-04-29 NOTE — Anesthesia Pain Management Evaluation Note (Signed)
  CRNA Pain Management Visit Note  Patient: [name], 31 y.o., female  "Hello [facility] am a [facility]. We have an anesthesia team available at all times to provide care throughout the hospital, including epidural management and anesthesia for C-section. I don't know your plan for the delivery whether it a natural birth, water birth, IV sedation, nitrous supplementation, doula or epidural, but we want to meet your pain goals."   1.Was your pain managed to your expectations on prior hospitalizations?   Yes   2.What is your expectation for pain management during this hospitalization?     Spinal for repeat C-section  3.How can we help you reach that goal? *Nsg intervention and support**  Record the patient's initial score and the patient's pain goal.   Pain: 0  Pain Goal: 3/10 [facility] wants you to be able to say your pain was always managed very well.  Cleda ClarksBrowder, Ionna Avis R 04/29/2017

## 2017-04-29 NOTE — Transfer of Care (Signed)
Immediate Anesthesia Transfer of Care Note  Patient: [name]  Procedure(s) Performed: CESAREAN SECTION (N/A )  Patient Location: PACU  Anesthesia Type:Spinal  Level of Consciousness: awake, alert  and oriented  Airway & Oxygen Therapy: Patient Spontanous Breathing  Post-op Assessment: Report given to RN 'vss   Post vital signs: Reviewed and stable  Last Vitals:  Vitals:   04/29/17 1149 04/29/17 1300  BP: 136/68 126/70  Pulse: 91 (!) 101  Resp: (!) 22 20  Temp: 36.7 C     Last Pain:  Vitals:   04/29/17 1300  TempSrc:   PainSc: 0-No pain         Complications: No apparent anesthesia complications

## 2017-04-29 NOTE — H&P (Signed)
[name] is a 31 y.o. female presenting for IOL for umbilical vein varix. Pregnancy complicated by polyhydramnios, previous C-section for failure to progress with 2 successful VBACs, A2 GDM on glyburide.  The patient is a occasional contractions, but has great movement.  Antenatal testing has been reassuring.  Growth ultrasound yesterday of 4898 g (10 pounds 13 ounces) with AC greater than 97th percentile.  Polyhydramnios preserved.  BPP 8 out of 8.  She has a proven pelvis to 9 pounds 9.4 ounces, which was her last baby 6 years ago.  She reports only pushing for 20-25 minutes with her last baby.  She also reports that she had minimal laceration that did not need sutures for repair.  OB History    Gravida Para Term Preterm AB Living   4 3 3  0 0 3   SAB TAB Ectopic Multiple Live Births   0 0 0 0 3     Past Medical History:  Diagnosis Date  . Back pain   . Gestational diabetes    Meds & diet controlled   Past Surgical History:  Procedure Laterality Date  . CESAREAN SECTION    . CHOLECYSTECTOMY     Family History: family history is not on file. Social History:  reports that she has never smoked. She has never used smokeless tobacco. She reports that she does not drink alcohol or use drugs.     Maternal Diabetes: Yes:  Diabetes Type:  Insulin/Medication controlled Genetic Screening: Normal Maternal Ultrasounds/Referrals: Abnormal:  Findings:   Other: Fetal Ultrasounds or other Referrals:  Other: LGA, polyhydramnios, umbilical vein varix Maternal Substance Abuse:  No Significant Maternal Medications:  Meds include: Other: Glyburide Significant Maternal Lab Results:  Lab values include: Group B Strep positive Other Comments:  None  ROS History   Last menstrual period 08/13/2016, currently breastfeeding.   Fetal Exam Fetal Monitor Review: Mode: hand-held doppler probe.   Variability: moderate (6-25 bpm).   Pattern: accelerations present and no decelerations.     Fetal State Assessment: Category I - tracings are normal.     Physical Exam  Prenatal labs: ABO, Rh: O/Positive/-- (04/23 0000) Antibody: Negative (04/23 0000) Rubella: Immune (04/23 0000) RPR: Nonreactive (08/27 0000)  HBsAg: Negative (04/23 0000)  HIV: Non-reactive (08/27 0000)  GBS:     Assessment/Plan: Active Problems:   Gestational diabetes   History of VBAC   LGA (large for gestational age) fetus affecting management of mother   Polyhydramnios affecting pregnancy in third trimester   BMI 40.0-44.9, adult (HCC)   Umbilical Vein Varix   Group B Streptococcus carrier, +RV culture, currently pregnant   Umbilical vein abnormality complicating pregnancy  Had a long conversation with the patient and her husband regarding the baby's estimated fetal weight in the complications it poses to delivery.  I discussed risks of LGA babies and diabetic moms of having a greater AC leading to increased risk of shoulder dystocia, brachial plexus injuries, hypoxia, neurological deficits, and even death.  I discussed that her being diabetic and the baby's estimated be fetal weight being greater than 4500 g that having a repeat cesarean section is a possibility.  I discussed the risks of cesarean section with her including injury to intestines, bladder, and other adjacent organs, increased healing time and recovery, increased blood loss, DVT/DVE risk.   I proposed that if the patient wanted to continue with her trial of labor after cesarean, then we would proceed cautiously and alter plan of delivery at any point should she  request it or if it appears that there is slow progression.  After patient had time to discuss with husband, she indicated that she would like to proceed with repeat cesarean section. As patient ate at 6am, will wait 8 hours NPO, then proceed with RLTCS.      [name] 04/29/2017, 7:22 AM

## 2017-04-29 NOTE — Lactation Note (Signed)
This note was copied from a baby's chart. Lactation Consultation Note  Patient Name: [name] Reason for consult: Follow-up assessment Baby at 8 hr of life and mom called for help using the DEBP. Mom desires to ebf at this visit. She bf her boys for 7274yr each with no issues. She is afraid if baby continues getting bottles of formula she will stop latching. Discussed the formula being a MD order because the baby's BG was low and the bili was high. Mom was agreeable to supplementing her expressed milk or formula per volume guidelines with a cup or spoon. Encouraged her to latch baby at every feeding cue and post pump. Report given to RN.  Mom will offer the breast on demand, post pump, and supplement per MD order.   Maternal Data    Feeding Feeding Type: Formula Nipple Type: Slow - flow  LATCH Score                   Interventions    Lactation Tools Discussed/Used Pump Review: Setup, frequency, and cleaning;Milk Storage Initiated by:: ES Date initiated:: 04/29/17   Consult Status Consult Status: Follow-up Date: 04/30/17 Follow-up type: In-patient    [name] [name], 10:48 PM

## 2017-04-30 DIAGNOSIS — Z98891 History of uterine scar from previous surgery: Secondary | ICD-10-CM

## 2017-04-30 HISTORY — DX: History of uterine scar from previous surgery: Z98.891

## 2017-04-30 LAB — CBC
HEMATOCRIT: 30.9 % — AB (ref 36.0–46.0)
Hemoglobin: 10.5 g/dL — ABNORMAL LOW (ref 12.0–15.0)
MCH: 30.1 pg (ref 26.0–34.0)
MCHC: 34 g/dL (ref 30.0–36.0)
MCV: 88.5 fL (ref 78.0–100.0)
Platelets: 227 10*3/uL (ref 150–400)
RBC: 3.49 MIL/uL — AB (ref 3.87–5.11)
RDW: 14.7 % (ref 11.5–15.5)
WBC: 9.6 10*3/uL (ref 4.0–10.5)

## 2017-04-30 LAB — GLUCOSE, CAPILLARY
GLUCOSE-CAPILLARY: 66 mg/dL (ref 65–99)
Glucose-Capillary: 133 mg/dL — ABNORMAL HIGH (ref 65–99)

## 2017-04-30 LAB — BIRTH TISSUE RECOVERY COLLECTION (PLACENTA DONATION)

## 2017-04-30 MED ORDER — ENOXAPARIN SODIUM 60 MG/0.6ML ~~LOC~~ SOLN
60.0000 mg | SUBCUTANEOUS | Status: DC
  Filled 2017-04-30 (×2): qty 0.6

## 2017-04-30 MED ORDER — METFORMIN HCL 500 MG PO TABS
500.0000 mg | ORAL_TABLET | Freq: Two times a day (BID) | ORAL | Status: DC
  Administered 2017-04-30 – 2017-05-01 (×3): 500 mg via ORAL
  Filled 2017-04-30 (×5): qty 1

## 2017-04-30 NOTE — Plan of Care (Signed)
Problem: Activity: Goal: Ability to tolerate increased activity will improve Outcome: Completed/Met Date Met: 04/30/17 Pt ambulating in room without difficulty.  Encouraged pt to gradually increase ambulation.    Problem: Nutritional: Goal: Mothers verbalization of comfort with breastfeeding process will improve Pt reports long term she wants to breastfeed with occasional supplementation of formula.  Pt reports that right now she "has no milk" and therefore does not want to put the baby to the breast at this time.  Supply and demand discussed.  DEBP set up in the room.  Pt encouraged to call RN for assistance with latching when she is ready to latch the baby.  Pt giving formula at this time.   Problem: Pain Management: Goal: General experience of comfort will improve and pain level will decrease Outcome: Progressing Pt originally denied pain but is now reporting cramping and soreness that is a 7-8/10 in her mid/lower abdomen.  Pt reports pain worse with movement and breastfeeding.  Pt took motrin but declines anything stronger.  Will continue to monitor and offer medication.

## 2017-04-30 NOTE — Anesthesia Postprocedure Evaluation (Signed)
Anesthesia Post Note  Patient: [name]  Procedure(s) Performed: CESAREAN SECTION (N/A )     Patient location during evaluation: PACU Anesthesia Type: Spinal Level of consciousness: awake and alert Pain management: pain level controlled Vital Signs Assessment: post-procedure vital signs reviewed and stable Respiratory status: spontaneous breathing and respiratory function stable Cardiovascular status: blood pressure returned to baseline and stable Postop Assessment: spinal receding Anesthetic complications: no    Last Vitals:  Vitals:   04/29/17 2345 04/30/17 0240  BP: (!) 108/58   Pulse: 97   Resp: 18   Temp: 37 C   SpO2: 98% 98%    Last Pain:  Vitals:   04/29/17 2345  TempSrc: Oral  PainSc: 4                  Lewie LoronJohn Caroline Matters

## 2017-04-30 NOTE — Progress Notes (Signed)
ANTICOAGULATION CONSULT NOTE - Initial Consult  Pharmacy Consult for enoxaparin Indication: VTE prophylaxis  No Known Allergies  Patient Measurements: Height: 5\' 7"  (170.2 cm) Weight: 291 lb (132 kg) IBW/kg (Calculated) : 61.6  BMI: 45.5  Vital Signs: Temp: 98.6 F (37 C) (10/24 2345) Temp Source: Oral (10/24 2345) BP: 108/58 (10/24 2345) Pulse Rate: 97 (10/24 2345)  Labs:  Recent Labs  04/29/17 0729 04/30/17 0552  HGB 12.1 10.5*  HCT 35.6* 30.9*  PLT 278 227    CrCl cannot be calculated (Patient's most recent lab result is older than the maximum 21 days allowed.).   Medical History: Past Medical History:  Diagnosis Date  . Back pain   . Gestational diabetes    Meds & diet controlled    Assessment: Per the patients BMI of 45.5 and TBW 132 kg, will dose her enoxaparin by 0.5 mg/kg for VTE prophylaxis. Inquired about epidural catheter status & Rn reported after visually checking patient that no sign of cath remained. Should be appropriate to start Lovenox as ordered at 1500 today.  Goal of Therapy:  Monitor platelets by anticoagulation protocol: Yes   Plan:  Lovenox 60 mg SQ QD  Newell Corallaudia F Ortiz-Lopez, PharmD Candidate 04/30/2017,8:40 AM

## 2017-04-30 NOTE — Addendum Note (Signed)
Addendum  created 04/30/17 0853 by Zarinah Oviatt, CRNA   Sign clinical note    

## 2017-04-30 NOTE — Lactation Note (Signed)
This note was copied from a baby's chart. Lactation Consultation Note  Patient Name: [name] Date: 04/30/2017 Reason for consult: Follow-up assessment;Early term 37-38.6wks;Hyperbilirubinemia  Baby is on triple phototherapy at 19 hours of age.  Baby has been bottle fed with formula 7 times in past 24 hours.  Mom reports pumping last evening and she is not collecting any milk yet.  Mom reports she doesn't have milk yet [facility]. LC explained to mom early milk is colostrum and needs to empty breast to help establish more milk supply.  FOB giving baby bottle of formula and offered to stop to allow mom to breastfeed with assistance.  Mom is eating and wants to wait until she is finished and [facility]. LC explained important for baby to have frequent feedings with supplement and can breast feed as well. Mom to pump with DEBP when baby is getting formula and then work on hand expression to collect milk to give to baby.  Mom continues to reports she doesn't have milk, Lc encouraged mom to allow baby to latch and use phototherapy lights on baby during limited feedings of about 15-20 minutes.  Mom has 2 older children she breast fed and may not need assistance as much as encouragement that she can latch baby for feedings while in hospital.   LC encouraged FOB to hold baby on lights for feedings from bottle as he was sitting in chair with baby in blanket off of lights.  FOB returned baby to crib under lights and then took a phone call.  LC explained to mom baby needs to be awakened every 2  1/2 hours for feedings or feed baby on demand more frequently.  Mom verbalized understanding.  Mom to call as needed. LC to report MBU RN, Morrie SheldonAshley   Maternal Data    Feeding Feeding Type: Bottle Fed - Formula Nipple Type: Slow - flow  LATCH Score                   Interventions Interventions: Skin to skin;Hand  express;DEBP  Lactation Tools Discussed/Used     Consult Status Consult Status: Follow-up Follow-up type: In-patient    [name] 04/30/2017, 9:48 AM

## 2017-04-30 NOTE — Anesthesia Postprocedure Evaluation (Signed)
Anesthesia Post Note  Patient: Toneka OURO-Akondo  Procedure(s) Performed: CESAREAN SECTION (N/A )     Patient location during evaluation: Mother Baby Anesthesia Type: Spinal Level of consciousness: oriented and awake and alert Pain management: pain level controlled Vital Signs Assessment: post-procedure vital signs reviewed and stable Respiratory status: spontaneous breathing and respiratory function stable Cardiovascular status: blood pressure returned to baseline and stable Postop Assessment: no headache, no backache and no apparent nausea or vomiting Anesthetic complications: no    Last Vitals:  Vitals:   04/29/17 2345 04/30/17 0240  BP: (!) 108/58   Pulse: 97   Resp: 18   Temp: 37 C   SpO2: 98% 98%    Last Pain:  Vitals:   04/30/17 0730  TempSrc:   PainSc: 0-No pain   Pain Goal:                 Tesean Stump

## 2017-04-30 NOTE — Progress Notes (Signed)
Post Partum Day 1 Subjective: no complaints, up ad lib, voiding, tolerating PO and + flatus  Objective: Blood pressure (!) 108/58, pulse 97, temperature 98.6 F (37 C), temperature source Oral, resp. rate 18, height 5\' 7"  (1.702 m), weight 132 kg (291 lb), last menstrual period 08/13/2016, SpO2 98 %, unknown if currently breastfeeding.  Physical Exam:  General: alert, cooperative and no distress Lochia: appropriate Uterine Fundus: firm Incision: healing well, no significant drainage, no significant erythema DVT Evaluation: No evidence of DVT seen on physical exam. Negative Homan's sign. No significant calf/ankle edema.   Recent Labs  04/29/17 0729 04/30/17 0552  HGB 12.1 10.5*  HCT 35.6* 30.9*    Assessment/Plan: Plan for discharge tomorrow, Breastfeeding and Contraception Paraguard   LOS: 1 day   Festus HoltsBrian W Minervia Osso 04/30/2017, 8:30 AM

## 2017-05-01 MED ORDER — SENNOSIDES-DOCUSATE SODIUM 8.6-50 MG PO TABS: 2.0000 | ORAL_TABLET | ORAL | 0 refills | Status: DC

## 2017-05-01 MED ORDER — IBUPROFEN 600 MG PO TABS: 600.0000 mg | ORAL_TABLET | Freq: Four times a day (QID) | ORAL | 0 refills | Status: DC

## 2017-05-01 MED ORDER — OXYCODONE-ACETAMINOPHEN 5-325 MG PO TABS: 1.0000 | ORAL_TABLET | ORAL | 0 refills | Status: DC | PRN

## 2017-05-01 NOTE — Discharge Instructions (Signed)
Cesarean Delivery °Cesarean birth, or cesarean delivery, is the surgical delivery of a baby through an incision in the abdomen and the uterus. This may be referred to as a C-section. This procedure may be scheduled ahead of time, or it may be done in an emergency situation. °Tell a health care provider about: °· Any allergies you have. °· All medicines you are taking, including vitamins, herbs, eye drops, creams, and over-the-counter medicines. °· Any problems you or family members have had with anesthetic medicines. °· Any blood disorders you have. °· Any surgeries you have had. °· Any medical conditions you have. °· Whether you or any members of your family have a history of deep vein thrombosis (DVT) or pulmonary embolism (PE). °What are the risks? °Generally, this is a safe procedure. However, problems may occur, including: °· Infection. °· Bleeding. °· Allergic reactions to medicines. °· Damage to other structures or organs. °· Blood clots. °· Injury to your baby. ° °What happens before the procedure? °· Follow instructions from your health care provider about eating or drinking restrictions. °· Follow instructions from your health care provider about bathing before your procedure to help reduce your risk of infection. °· If you know that you are going to have a cesarean delivery, do not shave your pubic area. Shaving before the procedure may increase your risk of infection. °· Ask your health care provider about: °? Changing or stopping your regular medicines. This is especially important if you are taking diabetes medicines or blood thinners. °? Your pain management plan. This is especially important if you plan to breastfeed your baby. °? How long you will be in the hospital after the procedure. °? Any concerns you may have about receiving blood products if you need them during the procedure. °? Cord blood banking, if you plan to collect your baby’s umbilical cord blood. °· You may also want to ask your  health care provider: °? Whether you will be able to hold or breastfeed your baby while you are still in the operating room. °? Whether your baby can stay with you immediately after the procedure and during your recovery. °? Whether a family member or a person of your choice can go with you into the operating room and stay with you during the procedure, immediately after the procedure, and during your recovery. °· Plan to have someone drive you home when you are discharged from the hospital. °What happens during the procedure? °· Fetal monitors will be placed on your abdomen to monitor your heart rate and your baby's heart rate. °· Depending on the reason for your cesarean delivery, you may have a physical exam or additional testing, such as an ultrasound. °· An IV tube will be inserted into one of your veins. °· You may have your blood or urine tested. °· You will be given antibiotic medicine to help prevent infection. °· You may be given a special warming gown to wear to keep your temperature stable. °· Hair may be removed from your pubic area. °· The skin of your pubic area and lower abdomen will be cleaned with a germ-killing solution (antiseptic). °· A catheter may be inserted into your bladder through your urethra. This drains your urine during the procedure. °· You may be given one or more of the following: °? A medicine to numb the area (local anesthetic). °? A medicine to make you fall asleep (general anesthetic). °? A medicine (regional anesthetic) that is injected into your back or through a small   thin tube placed in your back (spinal anesthetic or epidural anesthetic). This numbs everything below the injection site and allows you to stay awake during your procedure. If this makes you feel nauseous, tell your health care provider. Medicines will be available to help reduce any nausea you may feel. °· An incision will be made in your abdomen, and then in your uterus. °· If you are awake during your  procedure, you may feel tugging and pulling in your abdomen, but you should not feel pain. If you feel pain, tell your health care provider immediately. °· Your baby will be removed from your uterus. You may feel more pressure or pushing while this happens. °· Immediately after birth, your baby will be dried and kept warm. You may be able to hold and breastfeed your baby. The umbilical cord may be clamped and cut during this time. °· Your placenta will be removed from your uterus. °· Your incisions will be closed with stitches (sutures). Staples, skin glue, or adhesive strips may also be applied to the incision in your abdomen. °· Bandages (dressings) will be placed over the incision in your abdomen. °The procedure may vary among health care providers and hospitals. °What happens after the procedure? °· Your blood pressure, heart rate, breathing rate, and blood oxygen level will be monitored often until the medicines you were given have worn off. °· You may continue to receive fluids and medicines through an IV tube. °· You will have some pain. Medicines will be available to help control your pain. °· To help prevent blood clots: °? You may be given medicines. °? You may have to wear compression stockings or devices. °? You will be encouraged to walk around when you are able. °· Hospital staff will encourage and support bonding with your baby. Your hospital may allow you and your baby to stay in the same room (rooming in) during your hospital stay to encourage successful breastfeeding. °· You may be encouraged to cough and breathe deeply often. This helps to prevent lung problems. °· If you have a catheter draining your urine, it will be removed as soon as possible after your procedure. °This information is not intended to replace advice given to you by your health care provider. Make sure you discuss any questions you have with your health care provider. °Document Released: 06/23/2005 Document Revised: 11/29/2015  Document Reviewed: 04/03/2015 °Elsevier Interactive Patient Education © 2017 [employer] ° °

## 2017-05-01 NOTE — Discharge Summary (Signed)
OB Discharge Summary     Patient Name: [name] DOB: [birth year] MRN: [id number]  Date of admission: 04/29/2017 Delivering MD: Lyndel SafeNEWTON, KIMBERLY NILES   Date of discharge: 05/01/2017  Admitting diagnosis: INDUCTION Repeat cesarean section, macrosomia, Gestational Diabetes  Intrauterine pregnancy: 3162w0d     Secondary diagnosis:  Principal Problem:   Status post repeat low transverse cesarean section Active Problems:   Gestational diabetes   History of VBAC   LGA (large for gestational age) fetus affecting management of mother   Polyhydramnios affecting pregnancy in third trimester   BMI 40.0-44.9, adult (HCC)   Umbilical Vein Varix   Group B Streptococcus carrier, +RV culture, currently pregnant   Umbilical vein abnormality complicating pregnancy  Additional problems:     Discharge diagnosis: Term Pregnancy Delivered and Type 2 DM                                                                                                Post partum procedures:none  Augmentation: none  Complications: None  Hospital course:  Sceduled C/S   31 y.o. yo [phone] at 8062w0d was admitted to the hospital 04/29/2017 for scheduled cesarean section with the following indication:Elective Repeat.  Membrane Rupture Time/Date: 2:36 PM ,04/29/2017   Patient delivered a Viable infant.04/29/2017  Details of operation can be found in separate operative note.  Pateint had an uncomplicated postpartum course.  She is ambulating, tolerating a regular diet, passing flatus, and urinating well. Patient is discharged home in stable condition on  05/01/17         Physical exam  Vitals:   04/30/17 0900 04/30/17 1130 04/30/17 1515 04/30/17 1755  BP: 120/73 140/76 116/65 130/74  Pulse: 99 98 98 (!) 111  Resp: 20 18 20 19   Temp: 98.1 F (36.7 C) (!) 97.5 F (36.4 C) 98.3 F (36.8 C) 98 F (36.7 C)  TempSrc: Oral Oral Oral Oral  SpO2:   100%   Weight:      Height:       General: alert, cooperative  and no distress Lochia: appropriate Uterine Fundus: firm Incision: Healing well with no significant drainage DVT Evaluation: No evidence of DVT seen on physical exam. Labs: Lab Results  Component Value Date   WBC 9.6 04/30/2017   HGB 10.5 (L) 04/30/2017   HCT 30.9 (L) 04/30/2017   MCV 88.5 04/30/2017   PLT 227 04/30/2017   CMP Latest Ref Rng & Units 02/27/2017  Glucose 65 - 99 mg/dL 562(Z111(H)  BUN 6 - 20 mg/dL 6  Creatinine 3.080.44 - 6.571.00 mg/dL 8.46(N0.34(L)  Sodium 629135 - 528145 mmol/L 133(L)  Potassium 3.5 - 5.1 mmol/L 3.6  Chloride 101 - 111 mmol/L 103  CO2 22 - 32 mmol/L 18(L)  Calcium 8.9 - 10.3 mg/dL 9.4  Total Protein 6.5 - 8.1 g/dL 7.0  Total Bilirubin 0.3 - 1.2 mg/dL 4.1(L1.7(H)  Alkaline Phos 38 - 126 U/L 100  AST 15 - 41 U/L 39  ALT 14 - 54 U/L 22    Discharge instruction: per After Visit Summary and "Baby and Me Booklet".  After visit meds:  Allergies  as of 05/01/2017   No Known Allergies     Medication List    STOP taking these medications   acetaminophen 325 MG tablet Commonly known as:  TYLENOL   cetirizine 10 MG tablet Commonly known as:  ZYRTEC   metoCLOPramide 10 MG tablet Commonly known as:  REGLAN   ranitidine 150 MG tablet Commonly known as:  ZANTAC     TAKE these medications   ACCU-CHEK FASTCLIX LANCETS Misc 1 Device by Percutaneous route 4 (four) times daily.   glucose blood test strip Commonly known as:  ACCU-CHEK GUIDE Use as instructed four times a day   glyBURIDE 2.5 MG tablet Commonly known as:  DIABETA 1 and 1/2 tablets in the morning with breakfast and before you go to bed.   ibuprofen 600 MG tablet Commonly known as:  ADVIL,MOTRIN Take 1 tablet (600 mg total) by mouth every 6 (six) hours.   oxyCODONE-acetaminophen 5-325 MG tablet Commonly known as:  PERCOCET/ROXICET Take 1 tablet by mouth every 4 (four) hours as needed (pain scale 4-7).   prenatal multivitamin Tabs tablet Take 1 tablet by mouth at bedtime.   senna-docusate 8.6-50 MG  tablet Commonly known as:  Senokot-S Take 2 tablets by mouth daily.       Diet: carb modified diet  Activity: Advance as tolerated. Pelvic rest for 6 weeks.   Outpatient follow up:4 weeks Follow up Appt: Future Appointments Date Time Provider Department Center  06/05/2017 10:40 AM Hermina Staggers, MD [facility]   Follow up Visit:No Follow-up on file.  Postpartum contraception: Paragard  Newborn Data: Live born female  Birth Weight: 9 lb 5 oz (4225 g) APGAR: 8, 8  Newborn Delivery   Birth date/time:  04/29/2017 14:36:00 Delivery type:  C-Section, Low Transverse  C-section categorization:  Repeat     Baby Feeding: Breast Disposition:home with mother   05/01/2017 Suella Broad, MD

## 2017-05-02 LAB — GLUCOSE, CAPILLARY: GLUCOSE-CAPILLARY: 96 mg/dL (ref 65–99)

## 2017-05-02 NOTE — Lactation Note (Signed)
This note was copied from a baby's chart. Lactation Consultation Note: Experienced BF mom. Baby continues under phototherapy. Mom reports her breasts are feeling a little fuller this morning. Reports she did breast feed the baby at 5 am and she nursed for 10 min. Reports no pain with latch. Encouraged to pump if baby does not nurse well to prevent engorgement. Reviewed engorgement prevention and treatment. Encouraged to breast feed first then give formula if baby does not nurse well. No questions at present. To call for assist prn  Patient Name: Girl Sherrine Maplesnicha OURO-Akondo ZOXWR'UToday's Date: 05/02/2017 Reason for consult: Follow-up assessment   Maternal Data Has patient been taught Hand Expression?: Yes Does the patient have breastfeeding experience prior to this delivery?: Yes  Feeding Feeding Type: Bottle Fed - Formula  LATCH Score                   Interventions    Lactation Tools Discussed/Used     Consult Status Consult Status: PRN    Pamelia HoitWeeks, Dionis Autry D 05/02/2017, 9:22 AM

## 2017-05-02 NOTE — Discharge Summary (Signed)
OB Discharge Summary     Patient Name: [name] DOB: [birth year] MRN: [id number]  Date of admission: 04/29/2017 Delivering MD: [name]   Date of discharge: 05/02/2017  Admitting diagnosis: INDUCTION Repeat cesarean section, macrosomia, Gestational Diabetes  Intrauterine pregnancy: 7689w0d     Secondary diagnosis:  Principal Problem:   Status post repeat low transverse cesarean section Active Problems:   Gestational diabetes   History of VBAC   LGA (large for gestational age) fetus affecting management of mother   Polyhydramnios affecting pregnancy in third trimester   BMI 40.0-44.9, adult (HCC)   Umbilical Vein Varix   Group B Streptococcus carrier, +RV culture, currently pregnant   Umbilical vein abnormality complicating pregnancy  Additional problems: none     Discharge diagnosis: Term Pregnancy Delivered and GDM A2                                                                                                Post partum procedures:none  Augmentation: N/A  Complications: None  Hospital course:  Scheduled C/S   31 y.o. yo [id number] at 389w0d was admitted to the hospital 04/29/2017 for scheduled cesarean section with the following indication:Elective Repeat and Macrosomia. Pt initially presented for IOL due to umb vein varix, but discussion was had concerning EFW >4500 in the presence of GDM and concern for delivery difficulties. Pt then elected a repeat LTCS. Membrane Rupture Time/Date: 2:36 PM ,04/29/2017   Patient delivered a Viable infant.04/29/2017  Details of operation can be found in separate operative note.  Pateint had an uncomplicated postpartum course.  She is ambulating, tolerating a regular diet, passing flatus, and urinating well. Patient is discharged home in stable condition on  05/02/17         Physical exam  Vitals:   04/30/17 1515 04/30/17 1755 05/01/17 1800 05/02/17 0605  BP: 116/65 130/74 118/66 118/67  Pulse: 98 (!) 111 92 83   Resp: 20 19 20 18   Temp: 98.3 F (36.8 C) 98 F (36.7 C) 97.8 F (36.6 C) 97.6 F (36.4 C)  TempSrc: Oral Oral Oral Oral  SpO2: 100%     Weight:      Height:       General: alert and cooperative Lochia: appropriate Uterine Fundus: firm Incision: PICO wound vac in place, dry and intact DVT Evaluation: No evidence of DVT seen on physical exam. Labs: Lab Results  Component Value Date   WBC 9.6 04/30/2017   HGB 10.5 (L) 04/30/2017   HCT 30.9 (L) 04/30/2017   MCV 88.5 04/30/2017   PLT 227 04/30/2017   CMP Latest Ref Rng & Units 02/27/2017  Glucose 65 - 99 mg/dL 295(A111(H)  BUN 6 - 20 mg/dL 6  Creatinine 2.130.44 - 0.861.00 mg/dL 5.78(I0.34(L)  Sodium 696135 - 295145 mmol/L 133(L)  Potassium 3.5 - 5.1 mmol/L 3.6  Chloride 101 - 111 mmol/L 103  CO2 22 - 32 mmol/L 18(L)  Calcium 8.9 - 10.3 mg/dL 9.4  Total Protein 6.5 - 8.1 g/dL 7.0  Total Bilirubin 0.3 - 1.2 mg/dL 2.8(U1.7(H)  Alkaline Phos 38 - 126 U/L 100  AST 15 - 41 U/L 39  ALT 14 - 54 U/L 22    Discharge instruction: per After Visit Summary and "Baby and Me Booklet".  After visit meds:  Allergies as of 05/02/2017   No Known Allergies     Medication List    STOP taking these medications   ACCU-CHEK FASTCLIX LANCETS Misc   acetaminophen 325 MG tablet Commonly known as:  TYLENOL   cetirizine 10 MG tablet Commonly known as:  ZYRTEC   glucose blood test strip Commonly known as:  ACCU-CHEK GUIDE   glyBURIDE 2.5 MG tablet Commonly known as:  DIABETA   metoCLOPramide 10 MG tablet Commonly known as:  REGLAN   ranitidine 150 MG tablet Commonly known as:  ZANTAC     TAKE these medications   ibuprofen 600 MG tablet Commonly known as:  ADVIL,MOTRIN Take 1 tablet (600 mg total) by mouth every 6 (six) hours.   oxyCODONE-acetaminophen 5-325 MG tablet Commonly known as:  PERCOCET/ROXICET Take 1 tablet by mouth every 4 (four) hours as needed (pain scale 4-7).   prenatal multivitamin Tabs tablet Take 1 tablet by mouth at bedtime.    senna-docusate 8.6-50 MG tablet Commonly known as:  Senokot-S Take 2 tablets by mouth daily.       Diet: routine diet  Activity: Advance as tolerated. Pelvic rest for 6 weeks.   Outpatient follow up:6 weeks- needs glucola Follow up Appt:Future Appointments Date Time Provider Department Center  06/05/2017 10:40 AM Katie Staggers, MD [facility]   Follow up Visit:No Follow-up on file.  Postpartum contraception: IUD Paragard  Newborn Data: Live born female  Birth Weight: 9 lb 5 oz (4225 g) APGAR: 8, 8  Newborn Delivery   Birth date/time:  04/29/2017 14:36:00 Delivery type:  C-Section, Low Transverse  C-section categorization:  Repeat     Baby Feeding: Breast Disposition:rooming in- most likely; infant under triple light therapy currently   05/02/2017 Cam Hai, CNM 7:27 AM

## 2017-05-03 ENCOUNTER — Ambulatory Visit: Payer: Self-pay

## 2017-05-03 NOTE — Lactation Note (Signed)
This note was copied from a baby's chart. Lactation Consultation Note  Patient Name: [name] OURO-Akondo ZOXWR'UToday's Date: 05/03/2017 Reason for consult: Follow-up assessment;Hyperbilirubinemia LC [facility].  And mom had fed the baby EBM via bottle, and baby stooled ,  LC updated doc flow sheets .  Mother informed of post-discharge support and given phone number to the lactation department, including services for phone call assistance; out-patient appointments; and breastfeeding support group. List of other breastfeeding resources in the community given in the handout. Encouraged mother to call for problems or concerns related to breastfeeding. [facility] expressed being [facility] and that her breast are comfortable.    [facility] today and mom aware of where and when to return the DEBP.    Maternal Data Has patient been taught Hand Expression?: Yes  Feeding Feeding Type: Breast Milk Nipple Type: Slow - flow  LATCH Score Latch: Too sleepy or reluctant, no latch achieved, no sucking elicited.  Audible Swallowing: None  Type of Nipple: Everted at rest and after stimulation (swollen areola )  Comfort (Breast/Nipple): Filling, red/small blisters or bruises, mild/mod discomfort  Hold (Positioning): Assistance needed to correctly position infant at breast and maintain latch.  LATCH Score: 4  Interventions Interventions: Breast feeding basics reviewed  Lactation Tools Discussed/Used Tools: Pump;Flanges Flange Size: 24 (#24 Flange a good fit , mom aware may need to increase size to #27 when milk comes in ) Breast pump type: Double-Electric Breast Pump (punped off over 100 ml ) WIC Program: Yes Pump Review: Setup, frequency, and cleaning   Consult Status Consult Status: Complete Date: 05/03/17 Follow-up type: In-patient    Matilde SprangMargaret Ann Bram Hottel 05/03/2017, 12:31 PM

## 2017-05-03 NOTE — Lactation Note (Signed)
This note was copied from a baby's chart. Lactation Consultation Note  Patient Name: [name] Reason for consult: Follow-up assessment;Other (Comment);Hyperbilirubinemia (baby gained ovr night / on single photo, for d/C today ) Baby is 5891 hours old ,  Per mom the baby was fussy during the night and gasey.  Milk in both breast/ nodules noted lateral aspects of the breast, other areas soft.  LC cleaned pump pieces and set up the DEBP , #24 F is a good fit , mom pumped  Off over 100 ml.  LC attempted to latch on the left breast / football and baby fussy, no latch , to much swelling. LC recommended  Completely the pumping session. Also instructed mom on the use breast shells.  Mom receptive to [facility].  Sore nipple and engorgement prevention and tx reviewed.  Mother informed of post-discharge support and given phone number to the lactation department, including services for phone call assistance; out-patient appointments; and breastfeeding support group. List of other breastfeeding resources in the community given in the handout. Encouraged mother to call for problems or concerns related to breastfeeding.  Maternal Data    Feeding Feeding Type: Breast Milk Nipple Type: Slow - flow  LATCH Score                   Interventions Interventions: Breast feeding basics reviewed;DEBP  Lactation Tools Discussed/Used Tools: Pump [facility]IC Program[facility]   Consult Status Consult Status: Follow-up Date: 05/03/17 Follow-up type: In-patient    [name] [name], 10:14 AM

## 2017-05-20 ENCOUNTER — Telehealth: Payer: Self-pay | Admitting: General Practice

## 2017-05-20 NOTE — Telephone Encounter (Signed)
Called and left message on VM in regards to Postpartum appointment on 06/10/17 at 8:40am.  Patient will need 2hr gtts as well.  Asked patient to give our office a call back if unable to keep this appointment.

## 2017-06-05 ENCOUNTER — Ambulatory Visit: Payer: BLUE CROSS/BLUE SHIELD | Admitting: Obstetrics and Gynecology

## 2017-06-10 ENCOUNTER — Other Ambulatory Visit (HOSPITAL_COMMUNITY)
Admission: RE | Admit: 2017-06-10 | Discharge: 2017-06-10 | Disposition: A | Discharge status: Home / Self Care | Payer: BLUE CROSS/BLUE SHIELD | Source: Ambulatory Visit | Attending: Advanced Practice Midwife | Admitting: Advanced Practice Midwife

## 2017-06-10 ENCOUNTER — Encounter: Payer: Self-pay | Admitting: Advanced Practice Midwife

## 2017-06-10 ENCOUNTER — Ambulatory Visit (INDEPENDENT_AMBULATORY_CARE_PROVIDER_SITE_OTHER): Payer: BLUE CROSS/BLUE SHIELD | Admitting: Advanced Practice Midwife

## 2017-06-10 VITALS — BP 124/80 | HR 78 | Ht 66.0 in

## 2017-06-10 DIAGNOSIS — Z1389 Encounter for screening for other disorder: Secondary | ICD-10-CM

## 2017-06-10 DIAGNOSIS — Z124 Encounter for screening for malignant neoplasm of cervix: Secondary | ICD-10-CM | POA: Diagnosis present

## 2017-06-10 DIAGNOSIS — Z8632 Personal history of gestational diabetes: Secondary | ICD-10-CM

## 2017-06-10 DIAGNOSIS — Z30430 Encounter for insertion of intrauterine contraceptive device: Secondary | ICD-10-CM | POA: Diagnosis not present

## 2017-06-10 DIAGNOSIS — O99815 Abnormal glucose complicating the puerperium: Secondary | ICD-10-CM

## 2017-06-10 DIAGNOSIS — Z308 Encounter for other contraceptive management: Secondary | ICD-10-CM

## 2017-06-10 DIAGNOSIS — Z3202 Encounter for pregnancy test, result negative: Secondary | ICD-10-CM

## 2017-06-10 LAB — POCT PREGNANCY, URINE: PREG TEST UR: NEGATIVE

## 2017-06-10 MED ORDER — IBUPROFEN 600 MG PO TABS: 600.0000 mg | ORAL_TABLET | Freq: Four times a day (QID) | ORAL | 0 refills | Status: DC

## 2017-06-10 MED ORDER — PARAGARD INTRAUTERINE COPPER IU IUD
1.0000 | INTRAUTERINE_SYSTEM | Freq: Once | INTRAUTERINE | Status: AC
  Administered 2017-06-10: 1 via INTRAUTERINE

## 2017-06-10 NOTE — Progress Notes (Signed)
Subjective:     [name] is a 31 y.o. female who presents for a postpartum visit. She is 6 weeks postpartum following a low cervical transverse Cesarean section. I have fully reviewed the prenatal and intrapartum course. The delivery was at 37 gestational weeks. Outcome: primary cesarean section, low transverse incision. Anesthesia: spinal. Postpartum course has been uncomplicated- states that BM are back to normal occur every other day, soft. Baby's course has been uncomplicated. Baby is feeding by breast. Bleeding no bleeding. Bowel function is normal. Bladder function is normal. Patient is not sexually active. Contraception method is none. Postpartum depression screening: negative.  The following portions of the patient's history were reviewed and updated as appropriate: allergies, current medications, past family history, past medical history, past social history, past surgical history and problem list.  Review of Systems A comprehensive review of systems was negative.   Objective:    There were no vitals taken for this visit.  General:  alert, cooperative, no distress and moderately obese   Breasts:  inspection negative, no nipple discharge or bleeding, no masses or nodularity palpable  Lungs: clear to auscultation bilaterally  Heart:  regular rate and rhythm, S1, S2 normal, no murmur, click, rub or gallop  Abdomen: soft, non-tender; bowel sounds normal; no masses,  no organomegaly   Vulva:  normal  Vagina: normal vagina, no discharge, exudate, lesion, or erythema  Cervix:  no cervical motion tenderness and mild bleeding after IUD placement   Corpus: normal, regular contour and anteflexed  Adnexa:  normal adnexa and no mass, fullness, tenderness  Rectal Exam: Not performed.         Procedure Note:  Patient identified, informed consent performed, signed copy in chart, time out was performed.  Urine pregnancy test negative.  Speculum placed in the vagina.  Cervix visualized.   Cleaned with Betadine x 2.  Grasped anteriourly/posteriorly with a single tooth tenaculum.  Uterus sounded to 9cm.  Paragard IUD placed per manufacturer's recommendations.  Strings trimmed to 3 cm. Pt tolerated procedure well.  Patient given post procedure instructions and IUD care card with expiration date.  Patient is asked to check IUD strings periodically and follow up in 4-6 weeks for IUD check- patient declined coming for follow up in 6 weeks for string check. Discussed signs of abnormality and seek immediate care if those occur.   Assessment:  1. History of gestational diabetes - Glucose tolerance, 2 hours  2. Postpartum abnormal glucose tolerance of mother - Glucose tolerance, 2 hours  3. Encounter for other contraceptive management - PARAGARD INTRAUTERINE COPPER IUD 1 each - ibuprofen (ADVIL,MOTRIN) 600 MG tablet; Take 1 tablet (600 mg total) by mouth every 6 (six) hours.  Dispense: 30 tablet; Refill: 0  4. Encounter for Pap smear of cervix with HPV DNA cotesting - Cytology - PAP  5. Encounter for IUD insertion --Placement of ParaGard IUD  -Discussed risk and benefits of contraceptive, patient desires ParaGard states she has had it before. -Educated on what to expect over the next few days with new placement of IUD   6. Postpartum care following cesarean delivery -Normal postpartum examination with no complications  -C/S incision healed with no tenderness    Plan:    1. Contraception: IUD- ParaGard inserted today  - Discussed follow up for string check in 6 weeks patient declined  2. Follow up in: 1 year for annual exam or as needed.

## 2017-06-10 NOTE — Patient Instructions (Signed)
IUD PLACEMENT POST-PROCEDURE INSTRUCTIONS  1. You may take Ibuprofen, Aleve or Tylenol for pain if needed.  Cramping should resolve within in 24 hours.  2. You may have a small amount of spotting.  You should wear a mini pad for the next few days.  3. You may have intercourse after 24 hours.  If you using this for birth control, it is effective immediately.  4. You need to call if you have any pelvic pain, fever, heavy bleeding or foul smelling vaginal discharge.  Irregular bleeding is common the first several months after having an IUD placed. You do not need to call for this reason unless you are concerned.  5. Shower or bathe as normal  6. You should have a follow-up appointmen  Intrauterine Device Information An intrauterine device (IUD) is inserted into your uterus to prevent pregnancy. There are two types of IUDs available:  Copper IUD-This type of IUD is wrapped in copper wire and is placed inside the uterus. Copper makes the uterus and fallopian tubes produce a fluid that kills sperm. The copper IUD can stay in place for 10 years.  Hormone IUD-This type of IUD contains the hormone progestin (synthetic progesterone). The hormone thickens the cervical mucus and prevents sperm from entering the uterus. It also thins the uterine lining to prevent implantation of a fertilized egg. The hormone can weaken or kill the sperm that get into the uterus. One type of hormone IUD can stay in place for 5 years, and another type can stay in place for 3 years.  Your health care provider will make sure you are a good candidate for a contraceptive IUD. Discuss with your health care provider the possible side effects. Advantages of an intrauterine device  IUDs are highly effective, reversible, long acting, and low maintenance.  There are no estrogen-related side effects.  An IUD can be used when breastfeeding.  IUDs are not associated with weight gain.  The copper IUD works immediately after  insertion.  The hormone IUD works right away if inserted within 7 days of your period starting. You will need to use a backup method of birth control for 7 days if the hormone IUD is inserted at any other time in your cycle.  The copper IUD does not interfere with your female hormones.  The hormone IUD can make heavy menstrual periods lighter and decrease cramping.  The hormone IUD can be used for 3 or 5 years.  The copper IUD can be used for 10 years. Disadvantages of an intrauterine device  The hormone IUD can be associated with irregular bleeding patterns.  The copper IUD can make your menstrual flow heavier and more painful.  You may experience cramping and vaginal bleeding after insertion. This information is not intended to replace advice given to you by your health care provider. Make sure you discuss any questions you have with your health care provider. Document Released: 05/27/2004 Document Revised: 11/29/2015 Document Reviewed: 12/12/2012 Elsevier Interactive Patient Education  2017 Elsevier Inc. 7. t in 4-8 weeks for a re-check to make sure you are not having any problems.

## 2017-06-11 LAB — CYTOLOGY - PAP
Diagnosis: NEGATIVE
HPV: NOT DETECTED

## 2017-06-11 LAB — GLUCOSE TOLERANCE, 2 HOURS
Glucose, 2 hour: 106 mg/dL (ref 65–139)
Glucose, GTT - Fasting: 78 mg/dL (ref 65–99)

## 2017-06-14 ENCOUNTER — Encounter: Payer: Self-pay | Admitting: Advanced Practice Midwife

## 2017-06-16 ENCOUNTER — Telehealth: Payer: Self-pay | Admitting: General Practice

## 2017-06-16 NOTE — Telephone Encounter (Signed)
-----   Message from [place], [place] sent at 06/14/2017  1:53 PM EST ----- Please inform of Nml PP GTT. No Type 2 Diabetes.

## 2017-06-16 NOTE — Telephone Encounter (Signed)
Called patient & informed her of results. Patient verbalized understanding & had no questions  

## 2017-07-07 HISTORY — PX: WISDOM TOOTH EXTRACTION: SHX21

## 2017-07-17 ENCOUNTER — Encounter: Payer: Self-pay | Admitting: Obstetrics and Gynecology

## 2017-07-17 ENCOUNTER — Ambulatory Visit (INDEPENDENT_AMBULATORY_CARE_PROVIDER_SITE_OTHER): Payer: BLUE CROSS/BLUE SHIELD | Admitting: Obstetrics and Gynecology

## 2017-07-17 VITALS — BP 109/59 | HR 70 | Wt 278.0 lb

## 2017-07-17 DIAGNOSIS — N939 Abnormal uterine and vaginal bleeding, unspecified: Secondary | ICD-10-CM

## 2017-07-17 DIAGNOSIS — N920 Excessive and frequent menstruation with regular cycle: Secondary | ICD-10-CM

## 2017-07-17 DIAGNOSIS — T8389XA Other specified complication of genitourinary prosthetic devices, implants and grafts, initial encounter: Principal | ICD-10-CM

## 2017-07-17 DIAGNOSIS — Z30431 Encounter for routine checking of intrauterine contraceptive device: Secondary | ICD-10-CM

## 2017-07-17 NOTE — Progress Notes (Signed)
Pt stated Have the IUD put in Dec 5....start having heavy bleeding/clotting.

## 2017-07-17 NOTE — Patient Instructions (Signed)
Follow-up in one month if bleeding persist

## 2017-07-20 NOTE — Progress Notes (Signed)
   GYNECOLOGY OFFICE VISIT NOTE  History:  32 y.o. [id number] here today for vaginal bleeding. Patient states she had her copper IUD placed on 06/10/17. Since then she has been having vaginal bleeding. Bleeding will fluctuate between heavy and light. She previously had ParaGard for 6 years but had it removed due to bleeding. Became pregnant after removal and had this new one placed postpartum. Patient states she should have looked into other options but did not know all her options.  She denies any abnormal vaginal discharge, pelvic pain, abdominal pain, or other concerns.   Past Medical History:  Diagnosis Date  . Back pain   . Gestational diabetes    Meds & diet controlled    Past Surgical History:  Procedure Laterality Date  . CESAREAN SECTION    . CESAREAN SECTION N/A 04/29/2017   Procedure: CESAREAN SECTION;  Surgeon: Federico FlakeNewton, Kimberly Niles, MD;  [facility];  Service: Obstetrics;  Laterality: N/A;  . CHOLECYSTECTOMY      The following portions of the patient's history were reviewed and updated as appropriate: allergies, current medications, past family history, past medical history, past social history, past surgical history and problem list.   Review of Systems:  Pertinent items noted in HPI and remainder of comprehensive ROS otherwise negative.   Objective:  Physical Exam BP (!) 109/59   Pulse 70   Wt 278 lb (126.1 kg)   BMI 44.87 kg/m  CONSTITUTIONAL: Well-developed, well-nourished female in no acute distress.  SKIN: Skin is warm and dry. No rash noted. Not diaphoretic. No erythema. No pallor. PSYCHIATRIC: Normal mood and affect. Normal behavior. Normal judgment and thought content. CARDIOVASCULAR: Normal heart rate noted RESPIRATORY: Effort and breath sounds normal, no problems with respiration noted ABDOMEN: Soft, no distention noted.  Non-tender, No guarding or rebound tenderness. PELVIC: Vaginal bleeding, IUD strings visualized  MUSCULOSKELETAL:  Normal range of motion. No edema noted.  Labs and Imaging No results found.  Assessment & Plan:  1. Heavy menses due to IUD [facility]) Recent placement of ParaGard. Patient encouraged to keep IUD in place until acclamation period. If bleeding is continuous or heavy for a prolonged time she will need to come back in to check for anemia and placement. IUD strings visualized today and no abdominal pain.   2. IUD check up  3. Vaginal bleeding  Handout given to patient on other forms of birth control.  Please refer to After Visit Summary for other counseling recommendations.   Return in about 1 month (around 08/17/2017), or if symptoms worsen or fail to improve.   Caryl AdaJazma Calistro Rauf, DO [facility]

## 2018-10-06 IMAGING — US US MFM FETAL NUCHAL TRANSLUCENCY
1 series · 15 of 28 positions shown · non-contrast
Comparison: none

[Series 1: us mfm fetal nuchal translucency · 15 of 29 slices shown]
[im 1/29]
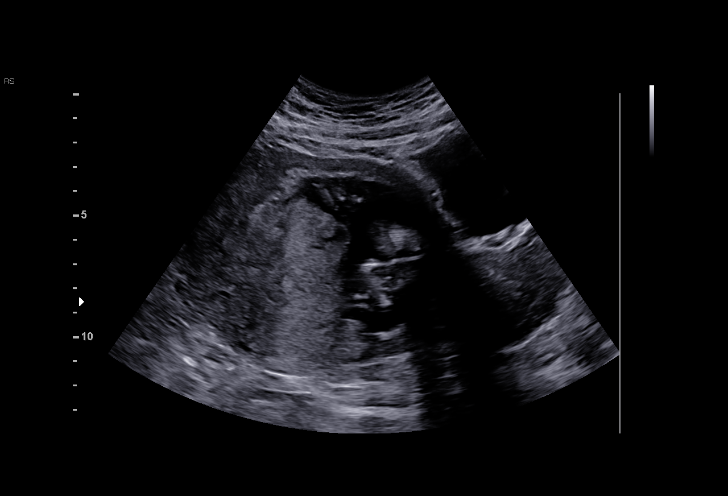
[im 3/29]
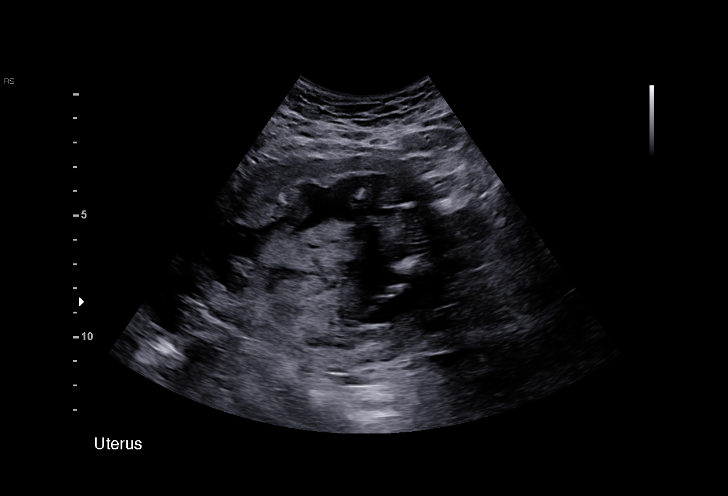
[im 5/29]
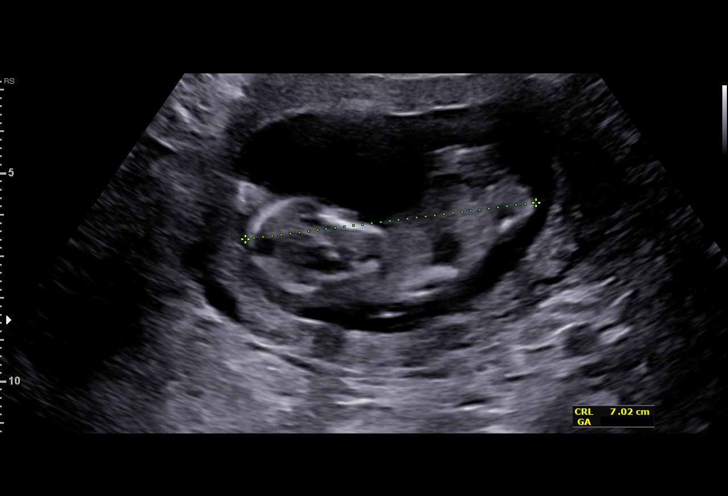
[im 7/29]
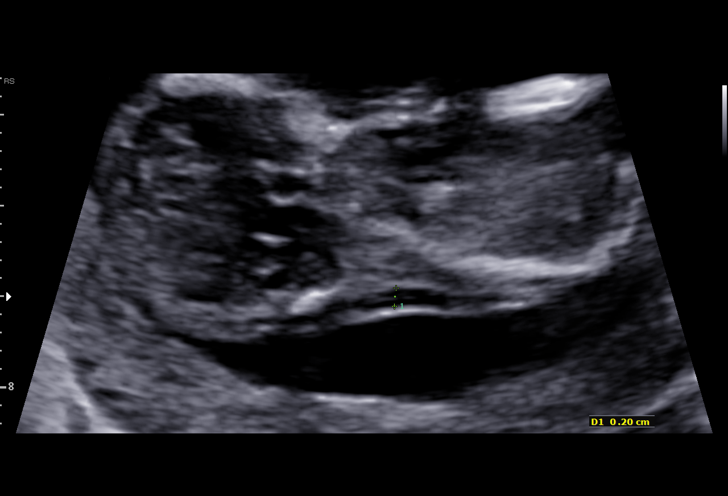
[im 9/29]
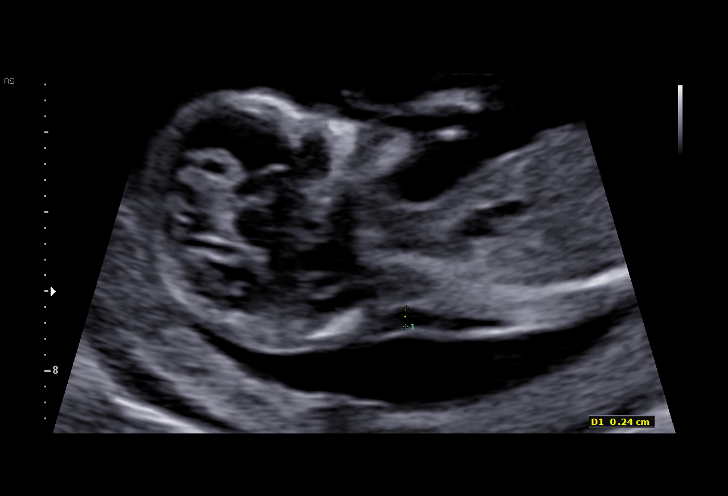
[im 11/29]
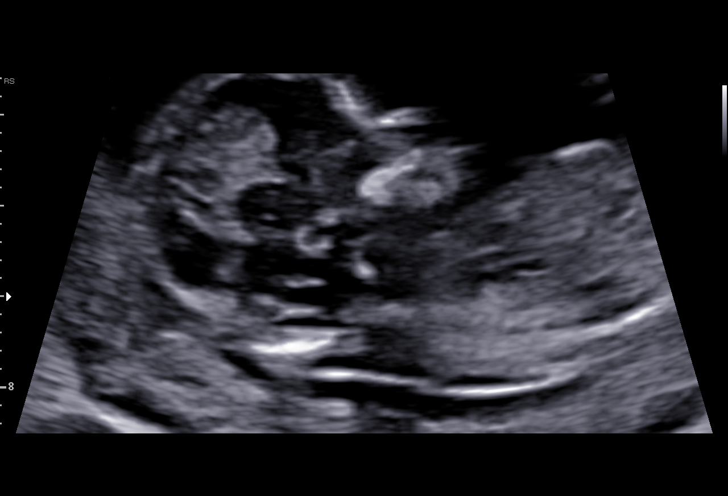
[im 13/29]
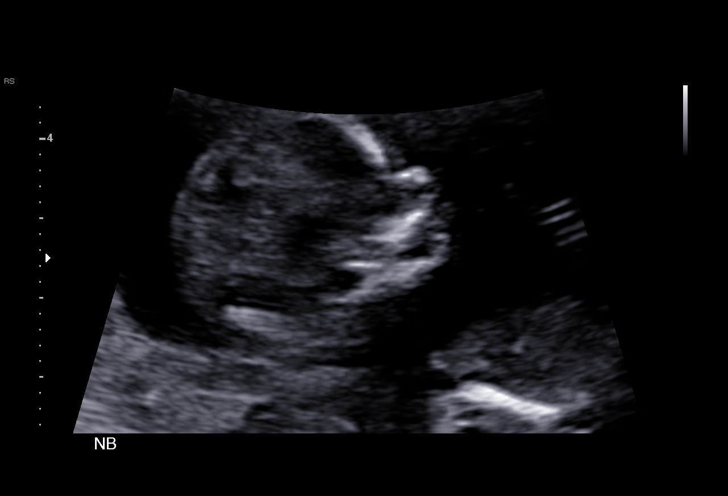
[im 15/29]
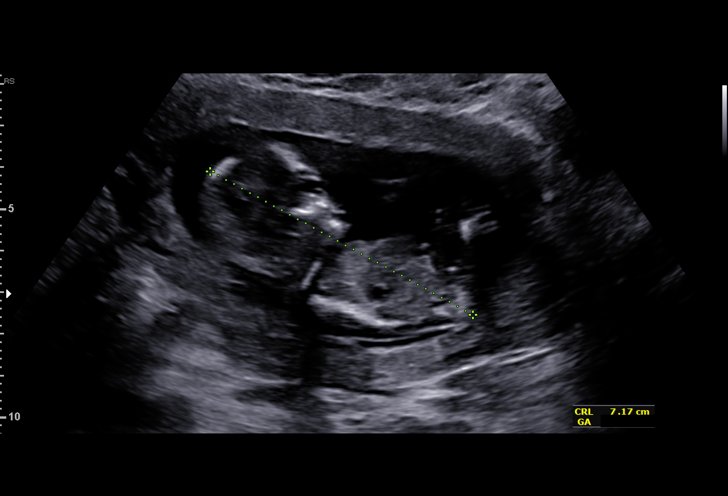
[im 16/29]
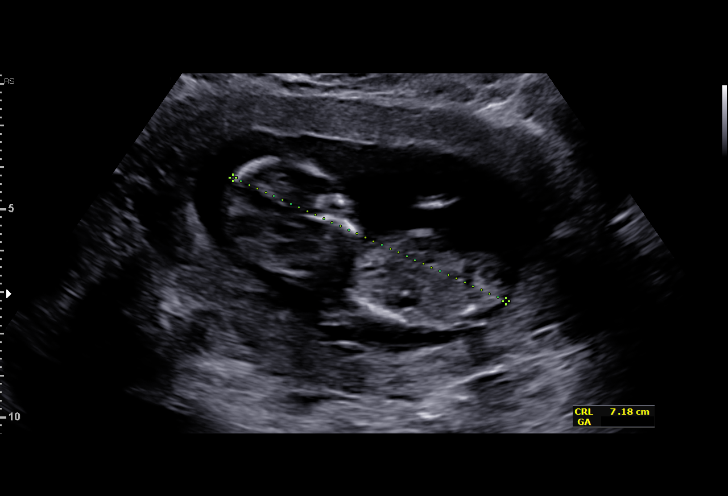
[im 18/29]
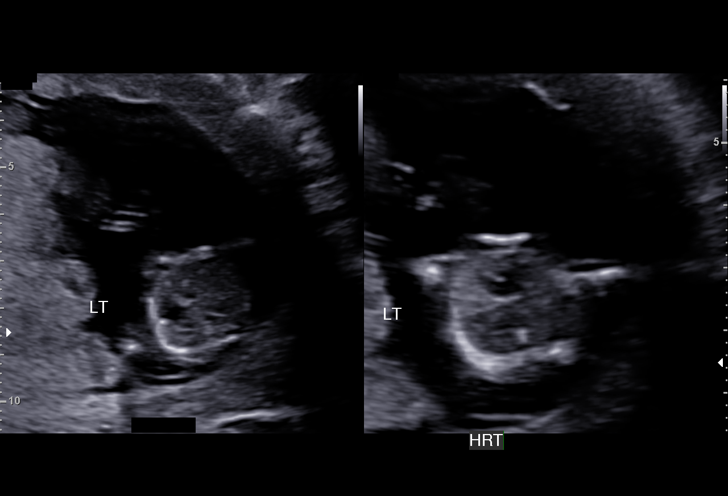
[im 20/29]
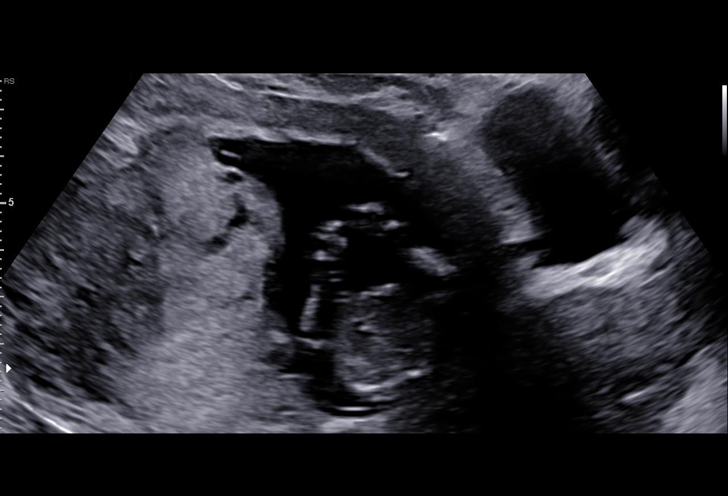
[im 22/29]
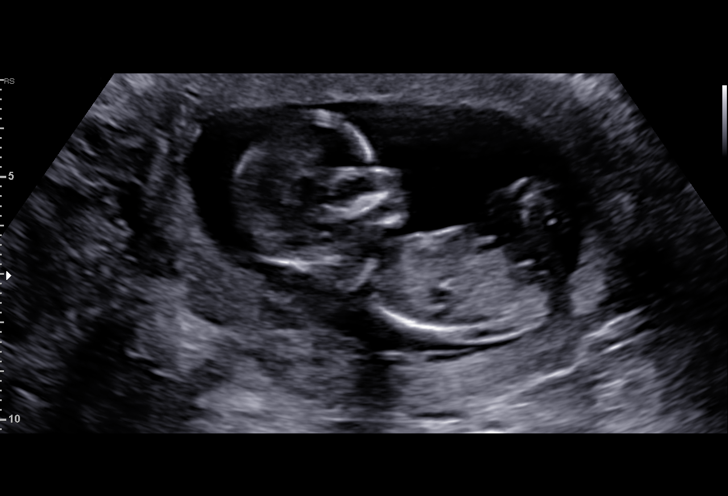
[im 24/29]
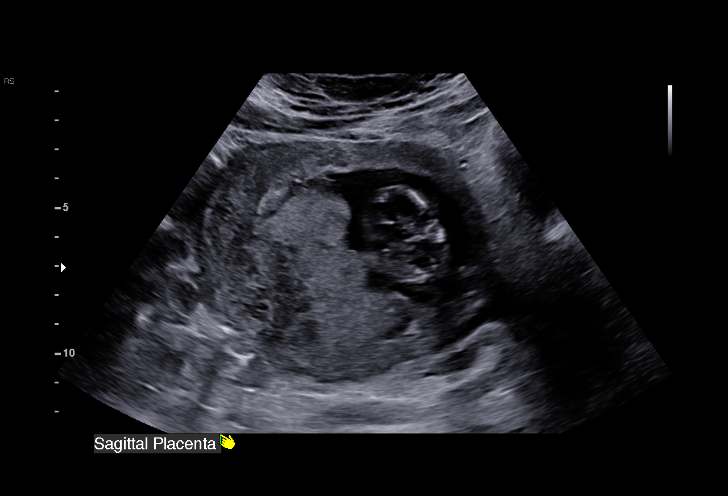
[im 26/29]
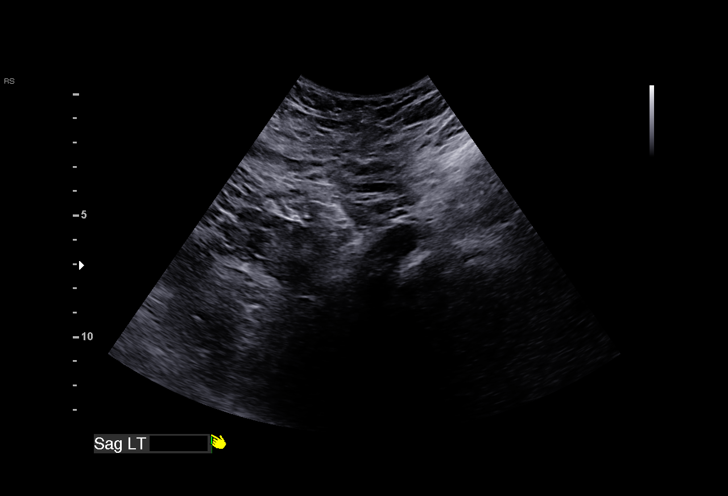
[im 29/29]
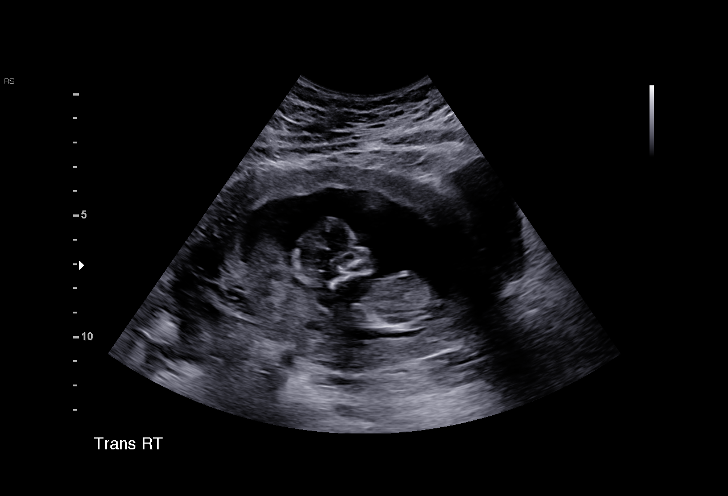

[15 of 28 positions shown; findings below may reference images not displayed]

[REDACTED]-
Faculty Physician

TRANSLUCENCY

1  MARVIN DANIEL ALBIR             [id number]      [id number]     [id number]
Indications

13 weeks gestation of pregnancy
Obesity complicating pregnancy, first
trimester
Encounter for nuchal translucency
OB History

Gravidity:    4         Term:   3
Living:       3
Fetal Evaluation

Num Of Fetuses:     1
Fetal Heart         145
Rate(bpm):
Cardiac Activity:   Observed
Presentation:       Variable
Placenta:           Posterior

Amniotic Fluid
AFI FV:      Subjectively within normal limits
Biometry

CRL:      71.2  mm     G. Age:  13w 1d                  EDD:   05/20/17
Gestational Age

LMP:           13w 1d       Date:   08/13/16                 EDD:   05/20/17
Best:          13w 1d    Det. By:   LMP  (08/13/16)          EDD:   05/20/17
1st Trimester Genetic Sonogram Screening

Nuc Trans:       2.3  mm
Nasal Bone:                 Present
Anatomy

Choroid Plexus:        Appears normal         Upper Extremities:      Visualized
Stomach:               Appears normal, left   Lower Extremities:      Visualized
sided
Cervix Uterus Adnexa

Uterus
No abnormality visualized.

Left Ovary
Not visualized.

Right Ovary
Not visualized.

Adnexa:       No abnormality visualized. No adnexal mass
visualized.
Impression

Single living intrauterine pregnancy at 25w2d.
CRL consistent with EDD based on LMP.
NT 2.3mm. Nasal bone present.
No gross fetal anomalies identified.
Recommendations

First trimester screen performed.
Level II U/S by 18 weeks

## 2019-02-09 IMAGING — US US MFM FETAL BPP W/O NON-STRESS
2 series · 15 of 17 positions shown · non-contrast
Comparison: none

[Series 1: us mfm fetal bpp w/o non-stress · 8 acquisitions, 7 frames shown (1 of 2)]
[im 1/8]
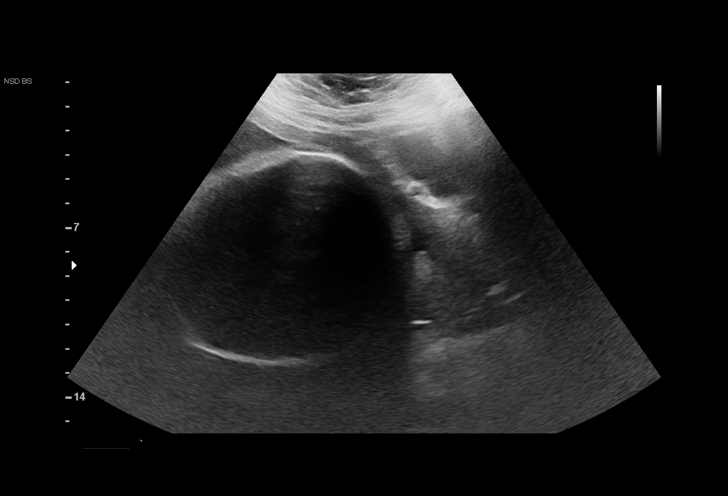
[im 2/8]
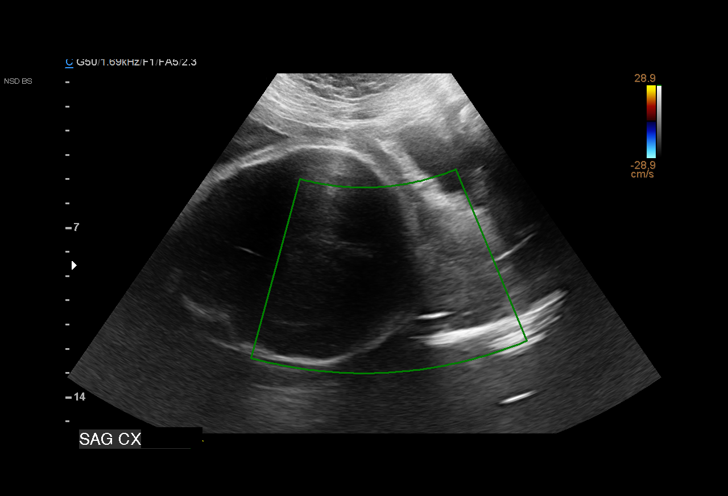
[im 3/8]
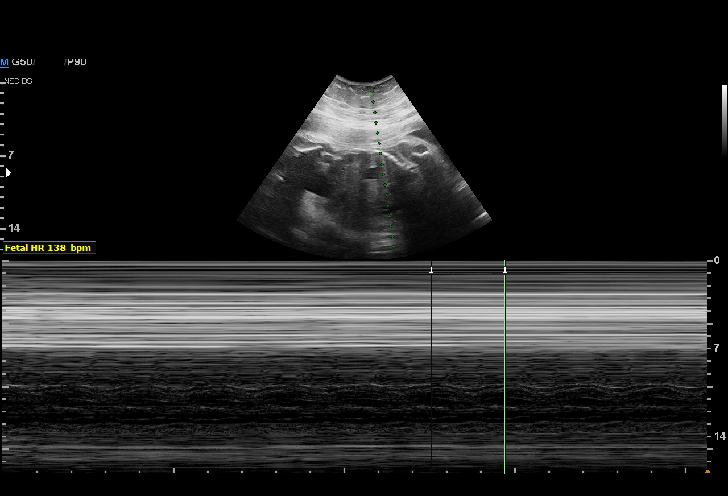
[im 4/8]
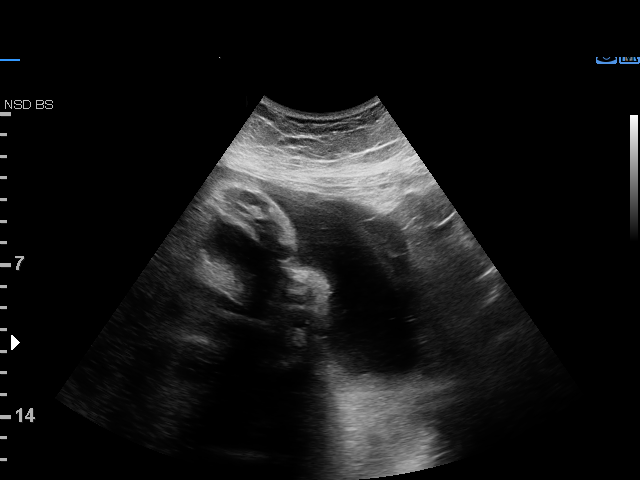
[im 6/8]
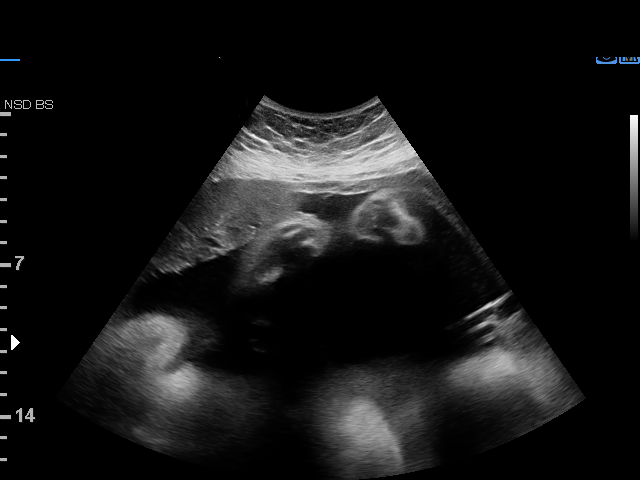
[im 7/8]
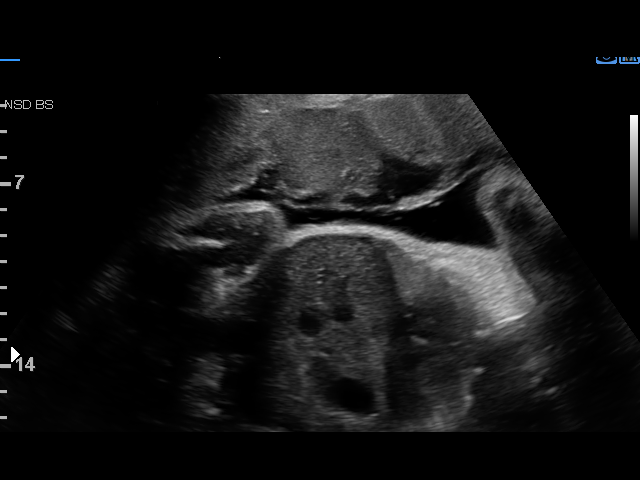
[im 8/8]
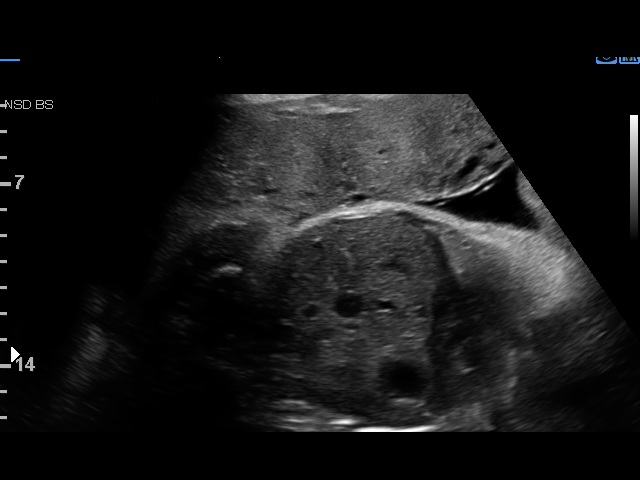

[Series 3: us mfm fetal bpp w/o non-stress · 8 of 9 slices shown (2 of 2)]
[im 1/9]
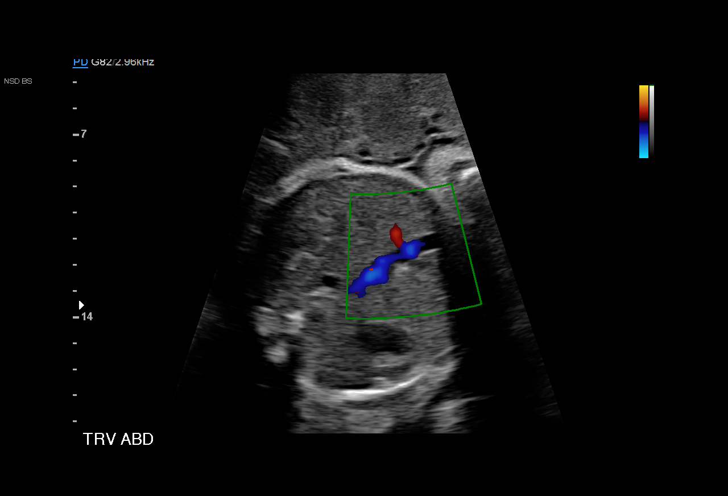
[im 2/9]
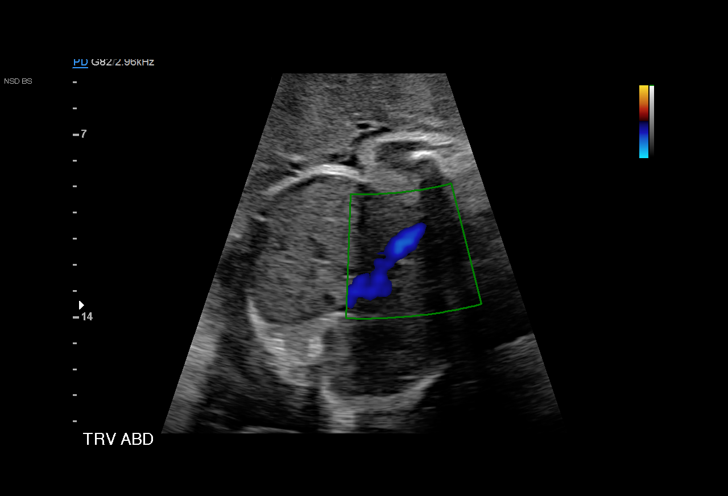
[im 3/9]
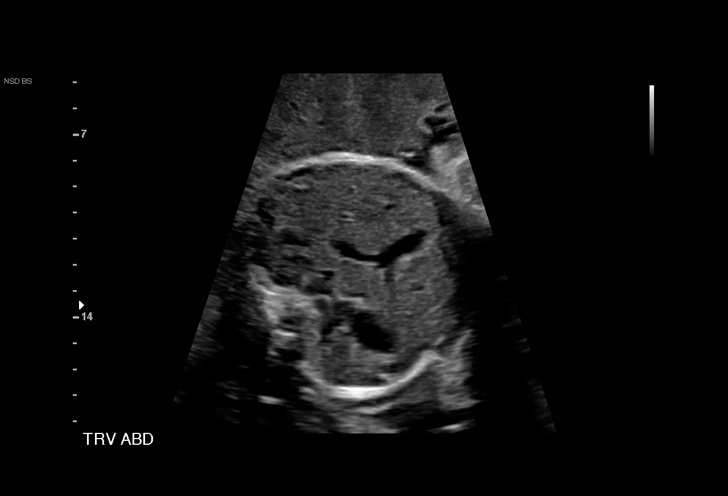
[im 4/9]
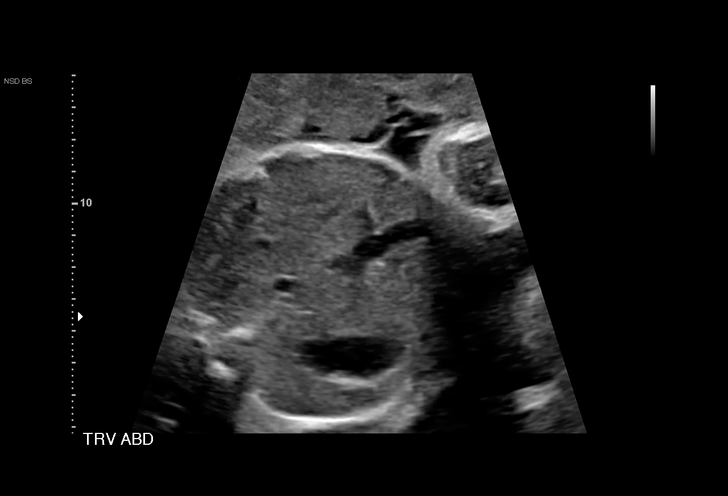
[im 6/9]
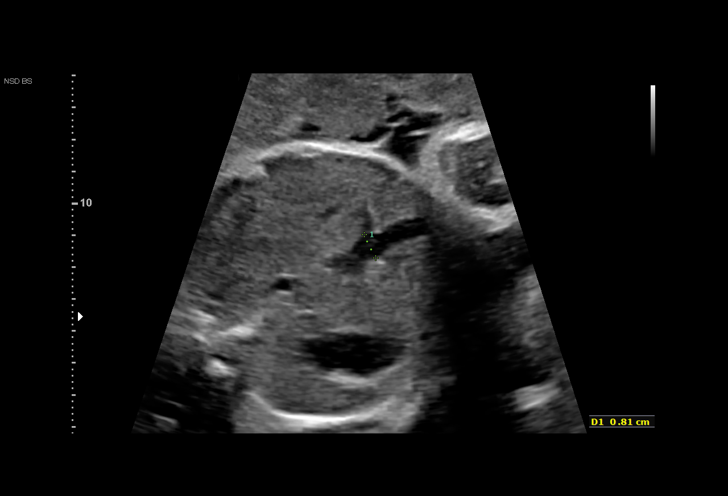
[im 7/9]
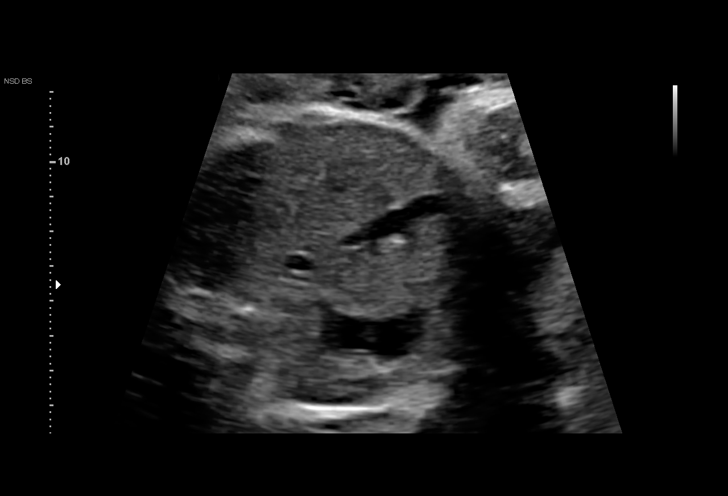
[im 8/9]
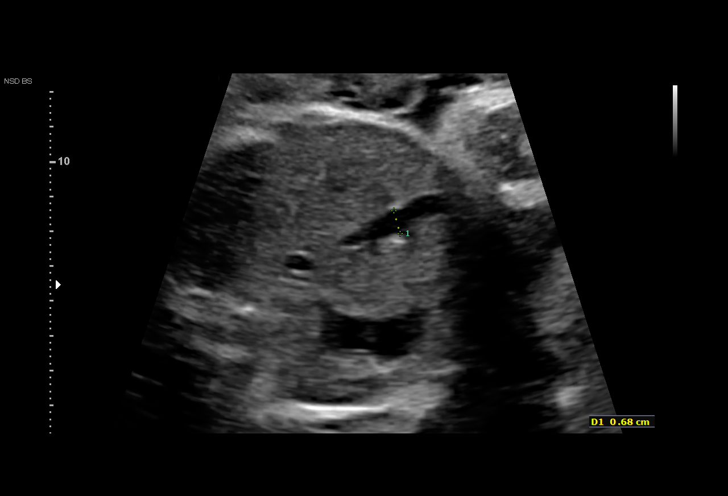
[im 9/9]
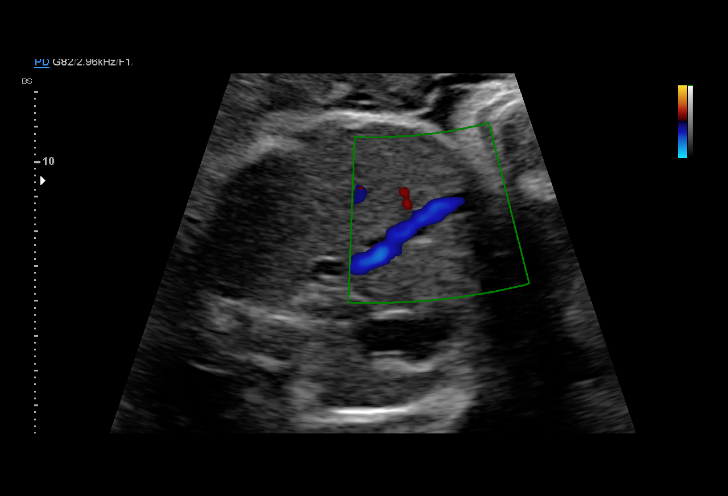

[15 of 17 positions shown; findings below may reference images not displayed]

OB/Gyn Clinic

1  LAISVIDAS CREEMERS           [id number]      [id number]     [id number]
Indications

31 weeks gestation of pregnancy
Obesity complicating pregnancy, third
trimester (pregravid BMI 44)
Uterine size-date discrepancy, third trimester
Gestational diabetes in pregnancy, diet
controlled
Previous cesarean delivery, antepartum
Umbilical vein abnormality complicating
pregnancy (UVV)
OB History

Gravidity:    4         Term:   3
Living:       3
Fetal Evaluation

Num Of Fetuses:     1
Fetal Heart         138
Rate(bpm):
Cardiac Activity:   Observed
Presentation:       Cephalic
Placenta:           Posterior Rt lateral, above cervical os
P. Cord Insertion:  Previously Visualized

Amniotic Fluid
AFI FV:      Subjectively within normal limits
AFI Sum(cm)     %Tile       Largest Pocket(cm)
21.[REDACTED]

RUQ(cm)       RLQ(cm)       LUQ(cm)        LLQ(cm)
10.73
Biophysical Evaluation

Amniotic F.V:   Pocket => 2 cm two         F. Tone:        Observed
planes
F. Movement:    Observed                   Score:          [DATE]
F. Breathing:   Observed
Gestational Age

LMP:           31w 1d       Date:   08/13/16                 EDD:   05/20/17
Best:          31w 1d    Det. By:   LMP  (08/13/16)          EDD:   05/20/17
Impression

SIUP at 31+1 weeks
UVV - unable to visualize UVV today due to fetal position;
normal flow in umbilical vein distal to varix
Normal amniotic fluid volume
BPP [DATE]
Recommendations

Continue twice weeky assessments

## 2019-07-06 ENCOUNTER — Telehealth: Payer: Self-pay | Admitting: Obstetrics and Gynecology

## 2019-07-06 ENCOUNTER — Ambulatory Visit: Payer: BLUE CROSS/BLUE SHIELD | Admitting: Student

## 2019-07-06 NOTE — Telephone Encounter (Signed)
Attempted to reach patient on both numbers listed in Epic. Left a voice message about her appointment being cancelled today.

## 2019-07-28 ENCOUNTER — Encounter: Payer: Self-pay | Admitting: Obstetrics and Gynecology

## 2019-07-28 ENCOUNTER — Ambulatory Visit (INDEPENDENT_AMBULATORY_CARE_PROVIDER_SITE_OTHER): Payer: Medicaid Other | Admitting: Obstetrics and Gynecology

## 2019-07-28 ENCOUNTER — Other Ambulatory Visit (HOSPITAL_COMMUNITY)
Admission: RE | Admit: 2019-07-28 | Discharge: 2019-07-28 | Disposition: A | Discharge status: Home / Self Care | Payer: Medicaid Other | Source: Ambulatory Visit | Attending: Obstetrics and Gynecology | Admitting: Obstetrics and Gynecology

## 2019-07-28 ENCOUNTER — Other Ambulatory Visit: Payer: Self-pay

## 2019-07-28 VITALS — BP 142/89 | HR 80 | Temp 97.9°F | Ht 67.0 in | Wt 311.1 lb

## 2019-07-28 DIAGNOSIS — Z0000 Encounter for general adult medical examination without abnormal findings: Secondary | ICD-10-CM | POA: Diagnosis not present

## 2019-07-28 DIAGNOSIS — N898 Other specified noninflammatory disorders of vagina: Secondary | ICD-10-CM | POA: Insufficient documentation

## 2019-07-28 DIAGNOSIS — L83 Acanthosis nigricans: Secondary | ICD-10-CM

## 2019-07-28 DIAGNOSIS — Z01419 Encounter for gynecological examination (general) (routine) without abnormal findings: Secondary | ICD-10-CM

## 2019-07-28 MED ORDER — FLUCONAZOLE 150 MG PO TABS: 150.0000 mg | ORAL_TABLET | Freq: Once | ORAL | 0 refills | Status: AC

## 2019-07-28 MED ORDER — CLOBETASOL PROPIONATE 0.05 % EX CREA: TOPICAL_CREAM | Freq: Two times a day (BID) | CUTANEOUS | Status: DC

## 2019-07-28 NOTE — Progress Notes (Signed)
GYNECOLOGY ANNUAL PREVENTATIVE CARE ENCOUNTER NOTE  History:     [name] is a 34 y.o. [phone] female here for a routine annual gynecologic exam.  Current complaints: vaginal itching and vaginal dryness. Pain with intercourse. Stopped breast feeding 3 months ago.  Denies abnormal vaginal bleeding, discharge, or other gynecologic concerns.   Gynecologic History Patient's last menstrual period was 07/09/2019 (exact date). Contraception: IUD Last Pap: 06/2017. Results were: normal with negative HPV Last mammogram: NA  Obstetric History OB History  Gravida Para Term Preterm AB Living  4 4 4  0 0 4  SAB TAB Ectopic Multiple Live Births  0 0 0 0 3    # Outcome Date GA Lbr Len/2nd Weight Sex Delivery Anes PTL Lv  4 Term 04/29/17 [redacted]w[redacted]d  9 lb 5 oz (4.225 kg) F CS-LTranv Spinal  LIV  3 Term 05/28/11 [redacted]w[redacted]d 13:32 / 02:37 9 lb 9.4 oz (4.349 kg) M Vag-Spont EPI  LIV  2 Term 02/08/09 [redacted]w[redacted]d  8 lb 5 oz (3.771 kg) M Vag-Spont   LIV  1 Term 03/31/06 [redacted]w[redacted]d  9 lb 8 oz (4.309 kg) F CS-LTranv EPI N LIV    Past Medical History:  Diagnosis Date  . Back pain   . Gestational diabetes    Meds & diet controlled    Past Surgical History:  Procedure Laterality Date  . CESAREAN SECTION    . CESAREAN SECTION N/A 04/29/2017   Procedure: CESAREAN SECTION;  Surgeon: 05/01/2017, MD;  Location: [facility] BIRTHING SU[facility];  Service: Obstetrics;  Laterality: N/A;  . CHOLECYSTECTOMY    . WISDOM TOOTH EXTRACTION  2019    Current Outpatient Medications on File Prior to Visit  Medication Sig Dispense Refill  . ibuprofen (ADVIL,MOTRIN) 600 MG tablet Take 1 tablet (600 mg total) by mouth every 6 (six) hours. (Patient not taking: Reported on 07/28/2019) 30 tablet 0  . oxyCODONE-acetaminophen (PERCOCET/ROXICET) 5-325 MG tablet Take 1 tablet by mouth every 4 (four) hours as needed (pain scale 4-7). (Patient not taking: Reported on 07/17/2017) 20 tablet 0  . Prenatal Vit-Fe Fumarate-FA (PRENATAL  MULTIVITAMIN) TABS tablet Take 1 tablet by mouth at bedtime.    . senna-docusate (SENOKOT-S) 8.6-50 MG tablet Take 2 tablets by mouth daily. (Patient not taking: Reported on 07/17/2017) 60 tablet 0   No current facility-administered medications on file prior to visit.    No Known Allergies  Social History:  reports that she has never smoked. She has never used smokeless tobacco. She reports that she does not drink alcohol or use drugs.  Family History  Problem Relation Age of Onset  . Hypertension Mother     The following portions of the patient's history were reviewed and updated as appropriate: allergies, current medications, past family history, past medical history, past social history, past surgical history and problem list.  Review of Systems Pertinent items noted in HPI and remainder of comprehensive ROS otherwise negative.  Physical Exam:  BP (!) 142/89   Pulse 80   Temp 97.9 F (36.6 C)   Ht 5\' 7"  (1.702 m)   Wt (!) 311 lb 1.6 oz (141.1 kg)   LMP 07/09/2019 (Exact Date)   BMI 48.73 kg/m  CONSTITUTIONAL: Well-developed, well-nourished female in no acute distress.  HENT:  Normocephalic, atraumatic, External right and left ear normal. Oropharynx is clear and moist EYES: Conjunctivae and EOM are normal. Pupils are equal, round, and reactive to light. No scleral icterus.  NECK: Normal range of motion, supple, no masses.  Normal thyroid.  SKIN: Skin is warm and dry. No rash noted. Not diaphoretic. No erythema. No pallor. MUSCULOSKELETAL: Normal range of motion. No tenderness.  No cyanosis, clubbing, or edema.  2+ distal pulses. NEUROLOGIC: Alert and oriented to person, place, and time. Normal reflexes, muscle tone coordination.  PSYCHIATRIC: Normal mood and affect. Normal behavior. Normal judgment and thought content. CARDIOVASCULAR: Normal heart rate noted, regular rhythm RESPIRATORY: Clear to auscultation bilaterally. Effort and breath sounds normal, no problems with  respiration noted. BREASTS: Symmetric in size. No masses, tenderness, nipple drainage, or lymphadenopathy bilaterally. Skin darkening and thickened skin beneath bilateral breasts.  ABDOMEN: Soft, no distention noted.  No tenderness, rebound or guarding.  PELVIC: thin, pale  appearing external genitalia and vulva; white vaginal discharge noted.   Assessment and Plan:   1. 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  - Rx: diflucan - OTC Replens, lubrication with intercourse always.   2. Encounter for annual routine gynecological examination  - Pap not due today- schedule pap 1/22, last pap normal  - HgB A1c- hx of GDM, and obesity.  - TSH  Routine preventative health maintenance measures emphasized. Please refer to After Visit Summary for other counseling recommendations.    Rasch, Artist Pais, [place] for [employer], [place]

## 2019-07-28 NOTE — Patient Instructions (Addendum)
[facility] [phone]  [address] [place]. [place], [place] [address]    Replens Vaginal lubrication.

## 2019-07-28 NOTE — Progress Notes (Signed)
Called pt following appt today to notify her of clobetasol cream being sent to pharmacy. Left VM at both home and mobile number stating a second medication has been sent to her pharmacy and encouraging her to call the office with questions. Callback number given.   Fleet Contras RN 07/28/19

## 2019-07-28 NOTE — Progress Notes (Signed)
Pt reports having vaginal itching x5-6 months. She denies discharge however odor is occasionally present. Pt has questions about weight loss and requests assistance.

## 2019-07-29 LAB — HEMOGLOBIN A1C
Est. average glucose Bld gHb Est-mCnc: 134 mg/dL
Hgb A1c MFr Bld: 6.3 % — ABNORMAL HIGH (ref 4.8–5.6)

## 2019-07-29 LAB — TSH: TSH: 1.36 u[IU]/mL (ref 0.450–4.500)

## 2019-07-29 NOTE — Progress Notes (Signed)
Your thyroid levels are normal. Your glucose testing shows that you have pre-diabetes. You need to see a primary care office. Please call [facility] and schedule a visit. Thank you,  Victorino Dike, NP

## 2019-08-01 ENCOUNTER — Telehealth: Payer: Self-pay

## 2019-08-01 ENCOUNTER — Telehealth: Payer: Self-pay | Admitting: Family Medicine

## 2019-08-01 MED ORDER — CLOBETASOL PROPIONATE 0.05 % EX CREA: 1.0000 "application " | TOPICAL_CREAM | Freq: Two times a day (BID) | CUTANEOUS | 0 refills | Status: DC

## 2019-08-01 NOTE — Telephone Encounter (Signed)
LM stating that I am returning her call if she continues to have questions or concerns to please give the office a call and hit the nurse call back line.    Addison Naegeli, RN

## 2019-08-01 NOTE — Telephone Encounter (Signed)
Pt called and stated that she was suppose to get two prescriptions from her pharmacy but she only got pills.  Called pt and pt informed me that above.  Per chart review, pt was to also receive Clobetasol cream.  Looking at orders medication was order accidentally like given in the office and not an electronic order.  Clobetasol cream reordered per Victorino Dike Rasch,NP note.    Addison Naegeli, RN 08/01/19

## 2019-08-01 NOTE — Telephone Encounter (Signed)
Requesting a call  Back to ask about a Rx refill that was not sent.

## 2019-08-03 ENCOUNTER — Telehealth: Payer: Self-pay

## 2019-08-03 ENCOUNTER — Other Ambulatory Visit: Payer: Self-pay | Admitting: Obstetrics and Gynecology

## 2019-08-03 LAB — CERVICOVAGINAL ANCILLARY ONLY
Bacterial Vaginitis (gardnerella): NEGATIVE
Candida Glabrata: NEGATIVE
Candida Vaginitis: POSITIVE — AB
Chlamydia: NEGATIVE
Comment: NEGATIVE
Comment: NEGATIVE
Comment: NEGATIVE
Comment: NEGATIVE
Comment: NEGATIVE
Comment: NORMAL
Neisseria Gonorrhea: NEGATIVE
Trichomonas: NEGATIVE

## 2019-08-03 MED ORDER — FLUCONAZOLE 150 MG PO TABS: 150.0000 mg | ORAL_TABLET | Freq: Every day | ORAL | 0 refills | Status: DC

## 2019-08-03 NOTE — Telephone Encounter (Signed)
Called Pt to advise of test results & that her Rx was sent for Diflucan. No answer, left message to call back.

## 2019-08-03 NOTE — Telephone Encounter (Signed)
-----   Message from Duane Lope, NP sent at 08/03/2019 12:46 PM EST ----- Can you please call the patient and let her know that I have Rx: Diflucan. Thank you.   Your wet prep shows that you have a vaginal yeast infection. I have sent a prescription to your pharmacy to treat the yeast infection. If you have any questions please call the office.   Victorino Dike, NP

## 2019-08-04 ENCOUNTER — Telehealth: Payer: Self-pay | Admitting: Family Medicine

## 2019-08-04 DIAGNOSIS — R7303 Prediabetes: Secondary | ICD-10-CM

## 2019-08-04 NOTE — Telephone Encounter (Signed)
Called pt and informed her that her HgbA1C shows that she is prediabetic and the provider recommends that she consults PCP for f/u.  I gave pt info to Primary Care at [facility] and advised her that if she does not hear back from them within two weeks to give them a call.  Pt verbalized understanding.   Addison Naegeli, RN 08/04/19

## 2019-08-04 NOTE — Telephone Encounter (Signed)
Patient is requesting a call back to get her results.

## 2019-10-17 ENCOUNTER — Encounter: Payer: Self-pay | Admitting: Internal Medicine

## 2019-10-17 ENCOUNTER — Telehealth (INDEPENDENT_AMBULATORY_CARE_PROVIDER_SITE_OTHER): Payer: Medicaid Other | Admitting: Internal Medicine

## 2019-10-17 DIAGNOSIS — Z7689 Persons encountering health services in other specified circumstances: Secondary | ICD-10-CM

## 2019-10-17 DIAGNOSIS — R7303 Prediabetes: Secondary | ICD-10-CM

## 2019-10-17 NOTE — Progress Notes (Signed)
Virtual Visit via Telephone Note  I connected with [name], on 10/17/2019 at 10:25 AM by telephone due to the COVID-19 pandemic and verified that I am speaking with the correct person using two identifiers.   Consent: I discussed the limitations, risks, security and privacy concerns of performing an evaluation and management service by telephone and the availability of in person appointments. I also discussed with the patient that there may be a patient responsible charge related to this service. The patient expressed understanding and agreed to proceed.   Location of Patient: Home   Location of Provider: Home    Persons participating in Telemedicine visit: [name] Dr. Earlene Plater      History of Present Illness: Patient has a visit to establish care.   Patient has prior h/o gestational DM. She did not have to take any medications. She was diagnosed with pre-diabetes at [facility] office in Jan 2021 with A1c 6.3%. She wants to know more about diet options for controlling her DM.    Past Medical History:  Diagnosis Date  . Back pain   . Gestational diabetes    Meds & diet controlled   No Known Allergies  Current Outpatient Medications on File Prior to Visit  Medication Sig Dispense Refill  . Prenatal Vit-Fe Fumarate-FA (PRENATAL MULTIVITAMIN) TABS tablet Take 1 tablet by mouth at bedtime.     No current facility-administered medications on file prior to visit.    Observations/Objective: NAD. Speaking clearly.  Work of breathing normal.  Alert and oriented. Mood appropriate.   Assessment and Plan: 1. Encounter to establish care  2. Prediabetes Reviewed A1c result and discussed that DM is A1c 6.5 or greater. Discussed diet and exercise to try to avoid progression to DM. Will plan to repeat A1c in 3 months. If still elevated, would consider starting Metformin. Patient does want to avoid medications if possible.     Follow Up  Instructions: 3 month f/uu    I discussed the assessment and treatment plan with the patient. The patient was provided an opportunity to ask questions and all were answered. The patient agreed with the plan and demonstrated an understanding of the instructions.   The patient was advised to call back or seek an in-person evaluation if the symptoms worsen or if the condition fails to improve as anticipated.     I provided 12 minutes total of non-face-to-face time during this encounter including median intraservice time, reviewing previous notes, investigations, ordering medications, medical decision making, coordinating care and patient verbalized understanding at the end of the visit.    Marcy Siren, D.O. Primary Care at [facility]  10/17/2019, 10:25 AM

## 2019-10-30 ENCOUNTER — Ambulatory Visit: Payer: Medicaid Other | Attending: Internal Medicine

## 2019-10-30 DIAGNOSIS — Z23 Encounter for immunization: Secondary | ICD-10-CM

## 2019-10-30 NOTE — Progress Notes (Signed)
   Covid-19 Vaccination Clinic  Name:  [name]    MRN: [id number] DOB: Apr 26, 1986  10/30/2019  [name] was observed post Covid-19 immunization for 15 minutes without incident. She was provided with Vaccine Information Sheet and instruction to access the V-Safe system.   [name] was instructed to call 911 with any severe reactions post vaccine: Marland Kitchen Difficulty breathing  . Swelling of face and throat  . A fast heartbeat  . A bad rash all over body  . Dizziness and weakness   Immunizations Administered    Name Date Dose VIS Date Route   Moderna COVID-19 Vaccine 10/30/2019  1:16 PM 0.5 mL 06/2019 Intramuscular   Manufacturer: Moderna   LotMadilyn Hook   NDC: 37342-876-81

## 2019-11-27 ENCOUNTER — Ambulatory Visit: Payer: Medicaid Other | Attending: Internal Medicine

## 2019-11-27 DIAGNOSIS — Z23 Encounter for immunization: Secondary | ICD-10-CM

## 2019-11-27 NOTE — Progress Notes (Signed)
   Covid-19 Vaccination Clinic  Name:  [name]    MRN: [id number] DOB: 11/21/1985  11/27/2019  [name] was observed post Covid-19 immunization for 15 minutes without incident. She was provided with Vaccine Information Sheet and instruction to access the V-Safe system.   [name] was instructed to call 911 with any severe reactions post vaccine: Marland Kitchen Difficulty breathing  . Swelling of face and throat  . A fast heartbeat  . A bad rash all over body  . Dizziness and weakness   Immunizations Administered    Name Date Dose VIS Date Route   Moderna COVID-19 Vaccine 11/27/2019  1:24 PM 0.5 mL 06/2019 Intramuscular   Manufacturer: Moderna   Lot: [id number]   NDC: 82060-156-15

## 2020-02-10 ENCOUNTER — Other Ambulatory Visit: Payer: Self-pay

## 2020-02-10 ENCOUNTER — Encounter (HOSPITAL_COMMUNITY): Payer: Self-pay

## 2020-02-10 ENCOUNTER — Ambulatory Visit (HOSPITAL_COMMUNITY)
Admission: EM | Admit: 2020-02-10 | Discharge: 2020-02-10 | Disposition: A | Discharge status: Home / Self Care | Payer: 59 | Attending: Family Medicine | Admitting: Family Medicine

## 2020-02-10 DIAGNOSIS — A084 Viral intestinal infection, unspecified: Secondary | ICD-10-CM | POA: Diagnosis not present

## 2020-02-10 MED ORDER — ONDANSETRON HCL 4 MG PO TABS: 4.0000 mg | ORAL_TABLET | Freq: Four times a day (QID) | ORAL | 0 refills | Status: DC

## 2020-02-10 MED ORDER — LIDOCAINE VISCOUS HCL 2 % MT SOLN
OROMUCOSAL | Status: AC
  Filled 2020-02-10: qty 15

## 2020-02-10 MED ORDER — ALUM & MAG HYDROXIDE-SIMETH 200-200-20 MG/5ML PO SUSP
30.0000 mL | Freq: Once | ORAL | Status: AC
  Administered 2020-02-10: 30 mL via ORAL

## 2020-02-10 MED ORDER — ALUM & MAG HYDROXIDE-SIMETH 200-200-20 MG/5ML PO SUSP
ORAL | Status: AC
  Filled 2020-02-10: qty 30

## 2020-02-10 MED ORDER — LIDOCAINE VISCOUS HCL 2 % MT SOLN
15.0000 mL | Freq: Once | OROMUCOSAL | Status: AC
  Administered 2020-02-10: 15 mL via ORAL

## 2020-02-10 NOTE — ED Provider Notes (Signed)
[facility]    CSN: [id number] Arrival date & time: 02/10/20  1446      History   Chief Complaint Chief Complaint  Patient presents with   Abdominal Pain    HPI [name] is a 34 y.o. female.   HPI  Patient states that she has 4 children at home.  Stomach flu has gone through the whole group.  She was the last to contract illness.  She has had nausea vomiting and diarrhea for the last 2 to 3 days.  No fever or chills.  She has no appetite.  Today she developed abdominal pain.  She points to her mid epigastrium.  Has not vomited since yesterday.  No blood in emesis.  No blood or mucus in the stool.  She is having frequent diarrhea that she states "looks like brown water" No recent travel  Past Medical History:  Diagnosis Date   Back pain    Fetal macrosomia 04/06/2017     Largest prior was 9lbs 8oz.   Gestational diabetes    Meds & diet controlled   History of cesarean delivery 04/30/2017    Patient Active Problem List   Diagnosis Date Noted   BMI 40.0-44.9, adult (HCC) 04/06/2017   History of gestational diabetes 03/16/2017    Past Surgical History:  Procedure Laterality Date   CESAREAN SECTION     CESAREAN SECTION N/A 04/29/2017   Procedure: CESAREAN SECTION;  Surgeon: Federico Flake, MD;  Location: [facility] BIRTHING SUITES;  Service: Obstetrics;  Laterality: N/A;   CHOLECYSTECTOMY     WISDOM TOOTH EXTRACTION  2019    OB History    Gravida  4   Para  4   Term  4   Preterm  0   AB  0   Living  4     SAB  0   TAB  0   Ectopic  0   Multiple  0   Live Births  3            Home Medications    Prior to Admission medications   Medication Sig Start Date End Date Taking? Authorizing Provider  ondansetron (ZOFRAN) 4 MG tablet Take 1-2 tablets (4-8 mg total) by mouth every 6 (six) hours. As needed nausea 02/10/20   Eustace Moore, MD    Family History Family History  Problem Relation Age of Onset    Hypertension Mother     Social History Social History   Tobacco Use   Smoking status: Never Smoker   Smokeless tobacco: Never Used  Vaping Use   Vaping Use: Never used  Substance Use Topics   Alcohol use: No   Drug use: No     Allergies   Patient has no known allergies.   Review of Systems Review of Systems See HPI  Physical Exam Triage Vital Signs ED Triage Vitals  Enc Vitals Group     BP 02/10/20 1607 125/88     Pulse Rate 02/10/20 1605 96     Resp 02/10/20 1605 16     Temp 02/10/20 1605 98 F (36.7 C)     Temp src --      SpO2 02/10/20 1605 98 %     Weight --      Height --      Head Circumference --      Peak Flow --      Pain Score 02/10/20 1605 9     Pain Loc --  Pain Edu? --      Excl. in GC? --    No data found.  Updated Vital Signs BP 125/88    Pulse 96    Temp 98 F (36.7 C)    Resp 16    LMP 02/10/2020    SpO2 98%      Physical Exam Constitutional:      General: She is in acute distress.     Appearance: She is well-developed. She is obese.     Comments: Acutely uncomfortable.  Holding abdomen  HENT:     Head: Normocephalic and atraumatic.     Mouth/Throat:     Mouth: Mucous membranes are dry.  Eyes:     Conjunctiva/sclera: Conjunctivae normal.     Pupils: Pupils are equal, round, and reactive to light.  Cardiovascular:     Rate and Rhythm: Normal rate and regular rhythm.  Pulmonary:     Effort: Pulmonary effort is normal. No respiratory distress.     Breath sounds: Normal breath sounds.  Abdominal:     General: There is no distension.     Palpations: Abdomen is soft.     Tenderness: There is abdominal tenderness. There is no guarding or rebound.     Comments: Tenderness in midepigastrium.  Moderate.  No guarding rebound.  No organomegaly or mass  Musculoskeletal:        General: Normal range of motion.     Cervical back: Normal range of motion.  Skin:    General: Skin is warm and dry.  Neurological:     General: No  focal deficit present.     Mental Status: She is alert.  Psychiatric:        Mood and Affect: Mood normal.        Behavior: Behavior normal.      UC Treatments / Results  Labs (all labs ordered are listed, but only abnormal results are displayed) Labs Reviewed - No data to display  EKG   Radiology No results found.  Procedures Procedures (including critical care time)  Medications Ordered in UC Medications  alum & mag hydroxide-simeth (MAALOX/MYLANTA) 200-200-20 MG/5ML suspension 30 mL (30 mLs Oral Given 02/10/20 1635)    And  lidocaine (XYLOCAINE) 2 % viscous mouth solution 15 mL (15 mLs Oral Given 02/10/20 1635)   Patient expressed improvement after the GI cocktail Initial Impression / Assessment and Plan / UC Course  I have reviewed the triage vital signs and the nursing notes.  Pertinent labs & imaging results that were available during my care of the patient were reviewed by me and considered in my medical decision making (see chart for details).     Reviewed recovery from viral gastroenteritis.  Fluid replacement. Final Clinical Impressions(s) / UC Diagnoses   Final diagnoses:  Viral gastroenteritis     Discharge Instructions     Take zofran for the nausea This should allow you to dring fluids Add bland diet as tolerated May take imodium for the diarrhea This is available at pharmacy May take antacid for the stomach pain   ED Prescriptions    Medication Sig Dispense Auth. Provider   ondansetron (ZOFRAN) 4 MG tablet Take 1-2 tablets (4-8 mg total) by mouth every 6 (six) hours. As needed nausea 15 tablet Eustace Moore, MD     PDMP not reviewed this encounter.   Eustace Moore, MD 02/10/20 2103

## 2020-02-10 NOTE — Discharge Instructions (Signed)
Take zofran for the nausea This should allow you to dring fluids Add bland diet as tolerated May take imodium for the diarrhea This is available at pharmacy May take antacid for the stomach pain

## 2020-02-10 NOTE — ED Triage Notes (Signed)
Pt c/o epigastric abdominal pain x 3 days, states pain is worse when taking a deep breath. Vomiting and diarrhea. Kids had same symptoms but have resolved

## 2020-03-30 ENCOUNTER — Other Ambulatory Visit: Payer: Self-pay

## 2020-03-30 ENCOUNTER — Encounter (HOSPITAL_COMMUNITY): Payer: Self-pay | Admitting: Family Medicine

## 2020-03-30 ENCOUNTER — Ambulatory Visit (HOSPITAL_COMMUNITY)
Admission: EM | Admit: 2020-03-30 | Discharge: 2020-03-30 | Disposition: A | Discharge status: Home / Self Care | Payer: 59 | Attending: Family Medicine | Admitting: Family Medicine

## 2020-03-30 DIAGNOSIS — L299 Pruritus, unspecified: Secondary | ICD-10-CM | POA: Diagnosis not present

## 2020-03-30 MED ORDER — HYDROXYZINE HCL 25 MG PO TABS: 25.0000 mg | ORAL_TABLET | Freq: Three times a day (TID) | ORAL | 0 refills | Status: DC | PRN

## 2020-03-30 MED ORDER — CETIRIZINE HCL 10 MG PO TABS: 10.0000 mg | ORAL_TABLET | Freq: Every day | ORAL | 0 refills | Status: DC

## 2020-03-30 NOTE — Discharge Instructions (Addendum)
Take the cetirizine during the day and the hydroxyzine at bedtime for itching Make sure you are keeping the skin very moisturized. Follow up as needed for continued or worsening symptoms

## 2020-03-30 NOTE — ED Provider Notes (Signed)
[facility]    CSN: [id number] Arrival date & time: 03/30/20  1038      History   Chief Complaint Chief Complaint  Patient presents with  . Pruritis    HPI [name] is a 34 y.o. female.   Patient is a 34 year old female who presents today with overall pruritus.  This is been present for the past 4 days.  The itching is worse at night.  Does report a history of dry skin.  Denies any specific rash.  No other associated symptoms.     Past Medical History:  Diagnosis Date  . Back pain   . Fetal macrosomia 04/06/2017     Largest prior was 9lbs 8oz.  . Gestational diabetes    Meds & diet controlled  . History of cesarean delivery 04/30/2017    Patient Active Problem List   Diagnosis Date Noted  . BMI 40.0-44.9, adult (HCC) 04/06/2017  . History of gestational diabetes 03/16/2017    Past Surgical History:  Procedure Laterality Date  . CESAREAN SECTION    . CESAREAN SECTION N/A 04/29/2017   Procedure: CESAREAN SECTION;  Surgeon: Federico Flake, MD;  Location: [facility] BIRTHING SUITES;  Service: Obstetrics;  Laterality: N/A;  . CHOLECYSTECTOMY    . WISDOM TOOTH EXTRACTION  2019    OB History    Gravida  4   Para  4   Term  4   Preterm  0   AB  0   Living  4     SAB  0   TAB  0   Ectopic  0   Multiple  0   Live Births  3            Home Medications    Prior to Admission medications   Medication Sig Start Date End Date Taking? Authorizing Provider  cetirizine (ZYRTEC) 10 MG tablet Take 1 tablet (10 mg total) by mouth daily. 03/30/20   Dahlia Byes A, NP  hydrOXYzine (ATARAX/VISTARIL) 25 MG tablet Take 1 tablet (25 mg total) by mouth every 8 (eight) hours as needed for itching. 03/30/20   Adja Ruff, Gloris Manchester A, NP  ondansetron (ZOFRAN) 4 MG tablet Take 1-2 tablets (4-8 mg total) by mouth every 6 (six) hours. As needed nausea 02/10/20   Eustace Moore, MD    Family History Family History  Problem Relation Age of Onset  .  Hypertension Mother     Social History Social History   Tobacco Use  . Smoking status: Never Smoker  . Smokeless tobacco: Never Used  Vaping Use  . Vaping Use: Never used  Substance Use Topics  . Alcohol use: No  . Drug use: No     Allergies   Patient has no known allergies.   Review of Systems Review of Systems   Physical Exam Triage Vital Signs ED Triage Vitals  Enc Vitals Group     BP 03/30/20 1231 126/78     Pulse Rate 03/30/20 1231 74     Resp 03/30/20 1231 16     Temp 03/30/20 1231 98.1 F (36.7 C)     Temp Source 03/30/20 1231 Oral     SpO2 03/30/20 1231 100 %     Weight --      Height --      Head Circumference --      Peak Flow --      Pain Score 03/30/20 1234 0     Pain Loc --  Pain Edu? --      Excl. in GC? --    No data found.  Updated Vital Signs BP 126/78 (BP Location: Left Arm)   Pulse 74   Temp 98.1 F (36.7 C) (Oral)   Resp 16   LMP 03/28/2020   SpO2 100%   Visual Acuity Right Eye Distance:   Left Eye Distance:   Bilateral Distance:    Right Eye Near:   Left Eye Near:    Bilateral Near:     Physical Exam Vitals and nursing note reviewed.  Constitutional:      General: She is not in acute distress.    Appearance: Normal appearance. She is not ill-appearing, toxic-appearing or diaphoretic.  HENT:     Head: Normocephalic.     Nose: Nose normal.     Mouth/Throat:     Pharynx: Oropharynx is clear.  Eyes:     Conjunctiva/sclera: Conjunctivae normal.  Pulmonary:     Effort: Pulmonary effort is normal.  Musculoskeletal:        General: Normal range of motion.     Cervical back: Normal range of motion.  Skin:    General: Skin is warm and dry.     Findings: No rash.  Neurological:     Mental Status: She is alert.  Psychiatric:        Mood and Affect: Mood normal.      UC Treatments / Results  Labs (all labs ordered are listed, but only abnormal results are displayed) Labs Reviewed - No data to  display  EKG   Radiology No results found.  Procedures Procedures (including critical care time)  Medications Ordered in UC Medications - No data to display  Initial Impression / Assessment and Plan / UC Course  I have reviewed the triage vital signs and the nursing notes.  Pertinent labs & imaging results that were available during my care of the patient were reviewed by me and considered in my medical decision making (see chart for details).     Pruritus Most likely due to dry skin.  There is no rash.  There is no other concerning symptoms.  We will do hydroxyzine at bedtime as needed, Zyrtec during the day.  Make sure you are keeping skin very moisturized. Follow up as needed for continued or worsening symptoms  Final Clinical Impressions(s) / UC Diagnoses   Final diagnoses:  Pruritus     Discharge Instructions     Take the cetirizine during the day and the hydroxyzine at bedtime for itching Make sure you are keeping the skin very moisturized. Follow up as needed for continued or worsening symptoms     ED Prescriptions    Medication Sig Dispense Auth. Provider   hydrOXYzine (ATARAX/VISTARIL) 25 MG tablet Take 1 tablet (25 mg total) by mouth every 8 (eight) hours as needed for itching. 20 tablet Azaylia Fong A, NP   cetirizine (ZYRTEC) 10 MG tablet Take 1 tablet (10 mg total) by mouth daily. 30 tablet Dahlia Byes A, NP     PDMP not reviewed this encounter.   Janace Aris, NP 03/30/20 1536

## 2020-03-30 NOTE — ED Triage Notes (Signed)
Patient in with complaints of generalized itching x 4 days. Patient denies any rash or new medications or soaps. Patient has not taken over the counter medications for itching.

## 2020-10-16 ENCOUNTER — Encounter: Payer: Self-pay | Admitting: Obstetrics and Gynecology

## 2020-10-16 ENCOUNTER — Other Ambulatory Visit (HOSPITAL_COMMUNITY)
Admission: RE | Admit: 2020-10-16 | Discharge: 2020-10-16 | Disposition: A | Discharge status: Home / Self Care | Payer: 59 | Source: Ambulatory Visit | Attending: Obstetrics and Gynecology | Admitting: Obstetrics and Gynecology

## 2020-10-16 ENCOUNTER — Ambulatory Visit (INDEPENDENT_AMBULATORY_CARE_PROVIDER_SITE_OTHER): Payer: 59 | Admitting: Obstetrics and Gynecology

## 2020-10-16 VITALS — BP 108/80 | HR 82 | Wt 295.2 lb

## 2020-10-16 DIAGNOSIS — Z113 Encounter for screening for infections with a predominantly sexual mode of transmission: Secondary | ICD-10-CM | POA: Insufficient documentation

## 2020-10-16 DIAGNOSIS — B373 Candidiasis of vulva and vagina: Secondary | ICD-10-CM | POA: Insufficient documentation

## 2020-10-16 DIAGNOSIS — Z01419 Encounter for gynecological examination (general) (routine) without abnormal findings: Secondary | ICD-10-CM | POA: Insufficient documentation

## 2020-10-16 DIAGNOSIS — Z6841 Body Mass Index (BMI) 40.0 and over, adult: Secondary | ICD-10-CM | POA: Diagnosis not present

## 2020-10-16 DIAGNOSIS — L83 Acanthosis nigricans: Secondary | ICD-10-CM | POA: Diagnosis not present

## 2020-10-16 MED ORDER — HYDROXYZINE HCL 25 MG PO TABS: 25.0000 mg | ORAL_TABLET | Freq: Three times a day (TID) | ORAL | 0 refills | Status: DC | PRN

## 2020-10-16 MED ORDER — CLOTRIMAZOLE-BETAMETHASONE 1-0.05 % EX CREA: 1.0000 "application " | TOPICAL_CREAM | Freq: Two times a day (BID) | CUTANEOUS | 0 refills | Status: DC

## 2020-10-16 NOTE — Patient Instructions (Signed)
AREA FAMILY PRACTICE PHYSICIANS  [place] ([address]) . [facility] o [address] [place], [place] [address] o [phone] o Mon-Fri 8:30-12:30, 1:30-5:00 o Accepting Medicaid . [facility] o [address], [place], [place] [address] o [phone] o Mon-Fri 8:00-5:30 . [facility] o [address] [place], [place] [address] o [phone] o Mon, Tue, Thur, Fri 8:30-5:00, Wed 10:00-7:00 (closed 1-2pm) o Accepting Medicaid . [facility] o [address], [place], [place]  [address] o Phone - [phone]   Fax - [phone]  [place] ([address]) . [facility] o [address] [place], [place] [address] o [phone] o Mon-Fri 8:00-5:00 . [facility] ([facility]  o [address] [place], [place] [address] o [phone] o Mon-Fri 8:30-5:30, Sat (Oct.-Mar.) 9:00-1:00 o Accepting Medicaid  [place] ([address]) . [facility] o [address], [place], [place] [address] o [phone] o Mon-Fri 8:00-5:00  [place] ([address]) . [facility] o [address], [place], [place] [address] o [phone] o Mon-Fri 8:00-5:00 . [facility] o [address], [place], [place] [address] o [phone] o Mon-Fri 8:00-5:00 . [facility] o [address] [place], [place] [address] o [phone] o Mon-Fri 8:00-5:00 . [facility] o [address] [place] [address] o [phone] o Mon-Fri 7:30-5:30  [place] ([address] & [address]) . [facility] o [address] [place], [place] [address] o [phone] o Mon-Thur 8:00-6:00 o Accepting Medicaid . [facility] o [address] [place], [place] [address] o [phone] o Mon-Thur 7:30-7:30, Fri 7:30-4:30 o Accepting Medicaid . [facility] o [address], [place], [place]  [address] o [phone]   Fax - [phone]  [place] ([address] & [address]) . [facility] o [address] [place], [place] [address] o [phone] o Mon-Fri 7:00-5:00 . [facility] o [address], [place], [place] [address] o [phone] o Mon-Fri 8:00-5:00 o Accepting Medicaid . [facility] o [address], [place], [place] [address] o [phone] o Mon-Fri 8:00-5:00 o Accepting Medicaid  [place] ([address]) . [facility] [place] o [address] [place], [place] [address] o [phone] o Mon-Fri 8:00-5:00 . [facility] o [address], [place], [place] [address] o [phone] o Mon-Fri 8:00-5:00 o Accepting Medicaid . [facility] o [address], [place], [place] [address] o [phone] o Mon-Fri 8:00-5:30, Sat&Sun by appointment (phones open at 8:30) o Accepting Medicaid  [place] ([address]) . [facility] o [address] [place], [place] [address] o [phone] o Mon-Thur 8:00-7:00, Fri 8:00-5:00, Sat 8:00-12:00, Sun 9:00-12:00 o Accepting Medicaid . [facility] [place] o [address], [place],  [address] o [phone] o Mon-Thur 8:00-5:00 o Accepting Medicaid . [facility] o [address] [place],  [address] o [phone] o Mon-Fri 8:00-5:30, Sat (Oct.-Mar.) 9:00-1:00 o Accepting Medicaid  [place]  ([address]) .   [facility] o [address] [place] [address], [place], [place] [address] o [phone] o Mon-Fri 8:00-5:00 o Accepting Medicaid   [place] ([address]) . [facility] [place] o [address], [place], [place] [address] o [phone] o Mon-Fri 8:00-5:00 . [facility] [place] o [address], [place], [place] [address] o [phone] o Mon-Fri 8:00-5:00 . [facility] [place] o [address] [place] [address], [place], [place] [address] o [phone] o Mon-Fri 8:00-5:00 o After hours clinic ([address] [place], [place] [address]) [phone] Mon-Fri 5:00-8:00, Sat 12:00-6:00, Sun 10:00-4:00 o Accepting Medicaid . [facility] [place] o [address], [place], [place]  [address] o [phone]   Fax - [phone]  [place] ([address]) . [facility] [place] o [address], [place], [place] [address] o [phone] o Mon-Fri 8:00-5:00 . [facility] [place] ([facility] [place]) o [address], [place], [place] [address] o [phone] o Mon-Thur 8:00-7:00, Fri 8:00-5:00, Sat 8:00-12:00    

## 2020-10-16 NOTE — Progress Notes (Signed)
GYNECOLOGY ANNUAL PREVENTATIVE CARE ENCOUNTER NOTE  History:     [name] is a 35 y.o. [phone] female here for a routine annual gynecologic exam.  Current complaints: continued skin changes under breast, intching of the vagina. She tried cream under breast which did not seem to help.    Denies abnormal vaginal bleeding, discharge, pelvic pain, problems with intercourse or other gynecologic concerns.   Gynecologic History Patient's last menstrual period was 09/24/2020 (approximate). Contraception: IUD Last Pap: 2018. Results were: normal with negative HPV Last mammogram: NA  Obstetric History OB History  Gravida Para Term Preterm AB Living  4 4 4  0 0 4  SAB IAB Ectopic Multiple Live Births  0 0 0 0 3    # Outcome Date GA Lbr Len/2nd Weight Sex Delivery Anes PTL Lv  4 Term 04/29/17 [redacted]w[redacted]d  9 lb 5 oz (4.225 kg) F CS-LTranv Spinal  LIV  3 Term 05/28/11 [redacted]w[redacted]d 13:32 / 02:37 9 lb 9.4 oz (4.349 kg) M Vag-Spont EPI  LIV  2 Term 02/08/09 [redacted]w[redacted]d  8 lb 5 oz (3.771 kg) M Vag-Spont   LIV  1 Term 03/31/06 [redacted]w[redacted]d  9 lb 8 oz (4.309 kg) F CS-LTranv EPI N LIV    Past Medical History:  Diagnosis Date  . Back pain   . Fetal macrosomia 04/06/2017     Largest prior was 9lbs 8oz.  . Gestational diabetes    Meds & diet controlled  . History of cesarean delivery 04/30/2017    Past Surgical History:  Procedure Laterality Date  . CESAREAN SECTION    . CESAREAN SECTION N/A 04/29/2017   Procedure: CESAREAN SECTION;  Surgeon: 05/01/2017, MD;  Location: [facility] BIRTHING SUIT[facility];  Service: Obstetrics;  Laterality: N/A;  . CHOLECYSTECTOMY    . WISDOM TOOTH EXTRACTION  2019    No current outpatient medications on file prior to visit.   No current facility-administered medications on file prior to visit.    No Known Allergies  Social History:  reports that she has never smoked. She has never used smokeless tobacco. She reports that she does not drink alcohol and does not use  drugs.  Family History  Problem Relation Age of Onset  . Hypertension Mother     The following portions of the patient's history were reviewed and updated as appropriate: allergies, current medications, past family history, past medical history, past social history, past surgical history and problem list.  Review of Systems Pertinent items noted in HPI and remainder of comprehensive ROS otherwise negative.  Physical Exam:  BP 108/80   Pulse 82   Wt 295 lb 3.2 oz (133.9 kg)   LMP 09/24/2020 (Approximate)   BMI 46.23 kg/m  CONSTITUTIONAL: Well-developed, well-nourished female in no acute distress.  HENT:  Normocephalic, atraumatic, External right and left ear normal.  EYES: Conjunctivae and EOM are normal. Pupils are equal, round, and reactive to light. No scleral icterus.  NECK: Normal range of motion, supple, no masses.  Normal thyroid.  SKIN: Skin is warm and dry. No rash noted. Not diaphoretic. No erythema. No pallor. MUSCULOSKELETAL: Normal range of motion. No tenderness.  No cyanosis, clubbing, or edema. NEUROLOGIC: Alert and oriented to person, place, and time. Normal reflexes, muscle tone coordination.  PSYCHIATRIC: Normal mood and affect. Normal behavior. Normal judgment and thought content. CARDIOVASCULAR: Normal heart rate noted, regular rhythm RESPIRATORY: Clear to auscultation bilaterally. Effort and breath sounds normal, no problems with respiration noted. BREASTS: Symmetric in size. No masses, tenderness,  nipple drainage, or lymphadenopathy bilaterally. Performed in the presence of a chaperone. + skin changes to bilateral breast skin folds; hyperpigmentation and thickened skin noted.  ABDOMEN: Soft, no distention noted.  No tenderness, rebound or guarding.  PELVIC: Normal appearing external genitalia and urethral meatus; normal appearing vaginal mucosa and cervix.  No abnormal discharge noted.  Pap smear obtained.  Normal uterine size, no other palpable masses, no uterine  or adnexal tenderness.  Performed in the presence of a chaperone.   Assessment and Plan:   1. Women's annual routine gynecological examination  - HgB A1c - TSH - Cytology - PAP( [place]) - Cervicovaginal ancillary only( [place]) - Rx clotrimazole for external vaginal itching, swabs pending.    2. Acanthosis nigricans  Needs to see Dermatology borderline DM which does increase risk of this.  Referral to healthy weight and wellness.  Refill given for hydroxyzine    Will follow up results of pap smear and manage accordingly. Routine preventative health maintenance measures emphasized. Please refer to After Visit Summary for other counseling recommendations.    [name], Katie Rutherford, NP  [facility] [employer], [facility]

## 2020-10-17 LAB — CERVICOVAGINAL ANCILLARY ONLY
Bacterial Vaginitis (gardnerella): NEGATIVE
Candida Glabrata: NEGATIVE
Candida Vaginitis: POSITIVE — AB
Chlamydia: NEGATIVE
Comment: NEGATIVE
Comment: NEGATIVE
Comment: NEGATIVE
Comment: NEGATIVE
Comment: NEGATIVE
Comment: NORMAL
Neisseria Gonorrhea: NEGATIVE
Trichomonas: NEGATIVE

## 2020-10-17 LAB — HEMOGLOBIN A1C
Est. average glucose Bld gHb Est-mCnc: 123 mg/dL
Hgb A1c MFr Bld: 5.9 % — ABNORMAL HIGH (ref 4.8–5.6)

## 2020-10-17 LAB — TSH: TSH: 1.49 u[IU]/mL (ref 0.450–4.500)

## 2020-10-18 ENCOUNTER — Telehealth: Payer: Self-pay | Admitting: Family Medicine

## 2020-10-18 ENCOUNTER — Other Ambulatory Visit: Payer: Self-pay | Admitting: Obstetrics and Gynecology

## 2020-10-18 ENCOUNTER — Telehealth: Payer: Self-pay | Admitting: Lactation Services

## 2020-10-18 MED ORDER — FLUCONAZOLE 150 MG PO TABS: 150.0000 mg | ORAL_TABLET | Freq: Every day | ORAL | 0 refills | Status: DC

## 2020-10-18 NOTE — Telephone Encounter (Signed)
Pt called back for results and stated she will pick up rx but still wants to speak with RN

## 2020-10-18 NOTE — Telephone Encounter (Signed)
Returned patient with results of yeast infection testing. Reviewed taking Diflucan once and then again in 3 days.   Patient reports itching rash under breasts, reviewed keeping clean and dry and can apply Monistat or Clotrimizole to the area a few times a day and that Diflucan will also help with that.  Patient voiced understanding.   Reviewed A1C results and informed patient she is still Pre-Diabetic although level has decreased.   Patient with no further questions or concerns.

## 2020-10-18 NOTE — Telephone Encounter (Signed)
-----   Message from Duane Lope, NP sent at 10/18/2020  8:38 AM EDT ----- Could you please call the patient and notify her of vaginal yeast infection? I am out of the office today and tomorrow. I have called in a prescription for diflucan x 2 doses.   Thank you,  Victorino Dike, NP

## 2020-10-18 NOTE — Telephone Encounter (Signed)
Called patient to inform her of + yeast infection. Patient did not answer. LM that a prescription has been sent to her Pharmacy. Advised her to call the office for results or with any questions or concerns.

## 2020-10-19 LAB — CYTOLOGY - PAP
Comment: NEGATIVE
Diagnosis: NEGATIVE
High risk HPV: NEGATIVE

## 2021-02-12 ENCOUNTER — Ambulatory Visit (HOSPITAL_COMMUNITY)
Admission: EM | Admit: 2021-02-12 | Discharge: 2021-02-12 | Disposition: A | Discharge status: Home / Self Care | Payer: 59 | Attending: Student | Admitting: Student

## 2021-02-12 ENCOUNTER — Other Ambulatory Visit: Payer: Self-pay

## 2021-02-12 ENCOUNTER — Encounter (HOSPITAL_COMMUNITY): Payer: Self-pay

## 2021-02-12 DIAGNOSIS — J020 Streptococcal pharyngitis: Secondary | ICD-10-CM

## 2021-02-12 DIAGNOSIS — Z112 Encounter for screening for other bacterial diseases: Secondary | ICD-10-CM | POA: Diagnosis not present

## 2021-02-12 LAB — POCT RAPID STREP A, ED / UC: Streptococcus, Group A Screen (Direct): NEGATIVE

## 2021-02-12 MED ORDER — LIDOCAINE VISCOUS HCL 2 % MT SOLN: 15.0000 mL | OROMUCOSAL | 0 refills | Status: DC | PRN

## 2021-02-12 MED ORDER — AMOXICILLIN 500 MG PO CAPS: 500.0000 mg | ORAL_CAPSULE | Freq: Two times a day (BID) | ORAL | 0 refills | Status: DC

## 2021-02-12 NOTE — ED Triage Notes (Signed)
Pt c/o headache, sore throat, neck pain that started 1 week ago, at home interventions taken with no improvement.

## 2021-02-12 NOTE — ED Provider Notes (Signed)
[facility]    CSN: [id number] Arrival date & time: 02/12/21  1232      History   Chief Complaint Chief Complaint  Patient presents with   Sore Throat    HPI [name] is a 35 y.o. female presenting with sore throat, headaches, neck pain x1 week. Medical history noncontributory. Describes anterior and posterior neck pain. Throbbing headache behind forehead. Throat pain, worse with swallowing and eating, but no difficulty swallowing or shortness of breath. Subjective chills at night, has not monitored temperature at home. Occ nonproductive cough. Denies shortness of breath, chest pain.  HPI  Past Medical History:  Diagnosis Date   Back pain    Fetal macrosomia 04/06/2017     Largest prior was 9lbs 8oz.   Gestational diabetes    Meds & diet controlled   History of cesarean delivery 04/30/2017    Patient Active Problem List   Diagnosis Date Noted   Acanthosis nigricans 10/16/2020   Women's annual routine gynecological examination 10/16/2020   BMI 40.0-44.9, adult (HCC) 04/06/2017   History of gestational diabetes 03/16/2017    Past Surgical History:  Procedure Laterality Date   CESAREAN SECTION     CESAREAN SECTION N/A 04/29/2017   Procedure: CESAREAN SECTION;  Surgeon: Federico Flake, MD;  Location: [facility] BIRTHING SUITES;  Service: Obstetrics;  Laterality: N/A;   CHOLECYSTECTOMY     WISDOM TOOTH EXTRACTION  2019    OB History     Gravida  4   Para  4   Term  4   Preterm  0   AB  0   Living  4      SAB  0   IAB  0   Ectopic  0   Multiple  0   Live Births  3            Home Medications    Prior to Admission medications   Medication Sig Start Date End Date Taking? Authorizing Provider  amoxicillin (AMOXIL) 500 MG capsule Take 1 capsule (500 mg total) by mouth 2 (two) times daily for 10 days. 02/12/21 02/22/21 Yes Rhys Martini, PA-C  ibuprofen (ADVIL) 600 MG tablet Take 600 mg by mouth every 6 (six) hours as needed.    Yes [provider]  lidocaine (XYLOCAINE) 2 % solution Use as directed 15 mLs in the mouth or throat as needed for mouth pain. 02/12/21  Yes Rhys Martini, PA-C  clotrimazole-betamethasone (LOTRISONE) cream Apply 1 application topically 2 (two) times daily. External use of vagina only 10/16/20   Rasch, Victorino Dike I, NP  fluconazole (DIFLUCAN) 150 MG tablet Take 1 tablet (150 mg total) by mouth daily. Take one dose today and repeat in 3 days. 10/18/20   Rasch, Victorino Dike I, NP  hydrOXYzine (ATARAX/VISTARIL) 25 MG tablet Take 1 tablet (25 mg total) by mouth every 8 (eight) hours as needed for itching. 10/16/20   Rasch, Harolyn Rutherford, NP    Family History Family History  Problem Relation Age of Onset   Hypertension Mother     Social History Social History   Tobacco Use   Smoking status: Never   Smokeless tobacco: Never  Vaping Use   Vaping Use: Never used  Substance Use Topics   Alcohol use: No   Drug use: No     Allergies   Patient has no known allergies.   Review of Systems Review of Systems  Constitutional:  Negative for appetite change, chills and fever.  HENT:  Positive  for sore throat. Negative for congestion, ear pain, rhinorrhea, sinus pressure, sinus pain, trouble swallowing and voice change.   Eyes:  Negative for redness and visual disturbance.  Respiratory:  Negative for cough, chest tightness, shortness of breath and wheezing.   Cardiovascular:  Negative for chest pain and palpitations.  Gastrointestinal:  Negative for abdominal pain, constipation, diarrhea, nausea and vomiting.  Genitourinary:  Negative for dysuria, frequency and urgency.  Musculoskeletal:  Negative for myalgias.  Neurological:  Negative for dizziness, weakness and headaches.  Psychiatric/Behavioral:  Negative for confusion.   All other systems reviewed and are negative.   Physical Exam Triage Vital Signs ED Triage Vitals  Enc Vitals Group     BP 02/12/21 1305 118/65     Pulse Rate  02/12/21 1305 99     Resp 02/12/21 1305 20     Temp 02/12/21 1305 97.8 F (36.6 C)     Temp Source 02/12/21 1305 Oral     SpO2 02/12/21 1305 98 %     Weight --      Height --      Head Circumference --      Peak Flow --      Pain Score 02/12/21 1302 10     Pain Loc --      Pain Edu? --      Excl. in GC? --    No data found.  Updated Vital Signs BP 118/65 (BP Location: Right Arm)   Pulse 99   Temp 97.8 F (36.6 C) (Oral)   Resp 20   LMP 01/30/2021 (Approximate)   SpO2 98%   Visual Acuity Right Eye Distance:   Left Eye Distance:   Bilateral Distance:    Right Eye Near:   Left Eye Near:    Bilateral Near:     Physical Exam Vitals reviewed.  Constitutional:      General: She is not in acute distress.    Appearance: Normal appearance. She is ill-appearing.  HENT:     Head: Normocephalic and atraumatic.     Right Ear: Tympanic membrane, ear canal and external ear normal. No tenderness. No middle ear effusion. There is no impacted cerumen. Tympanic membrane is not perforated, erythematous, retracted or bulging.     Left Ear: Tympanic membrane, ear canal and external ear normal. No tenderness.  No middle ear effusion. There is no impacted cerumen. Tympanic membrane is not perforated, erythematous, retracted or bulging.     Nose: Nose normal. No congestion.     Mouth/Throat:     Mouth: Mucous membranes are moist.     Pharynx: Uvula midline. Posterior oropharyngeal erythema present. No oropharyngeal exudate.     Tonsils: Tonsillar exudate present. 2+ on the right. 2+ on the left.     Comments: Smooth erythema posterior pharynx. On exam, uvula is midline, she is tolerating her secretions without difficulty, there is no trismus, no drooling, she has normal phonation  Eyes:     Extraocular Movements: Extraocular movements intact.     Pupils: Pupils are equal, round, and reactive to light.  Cardiovascular:     Rate and Rhythm: Normal rate and regular rhythm.     Heart  sounds: Normal heart sounds.  Pulmonary:     Effort: Pulmonary effort is normal.     Breath sounds: Normal breath sounds. No decreased breath sounds, wheezing, rhonchi or rales.  Abdominal:     Palpations: Abdomen is soft.     Tenderness: There is no abdominal tenderness. There is no  guarding or rebound.  Lymphadenopathy:     Cervical: Cervical adenopathy present.     Right cervical: Superficial cervical adenopathy present.     Left cervical: Superficial cervical adenopathy present.  Neurological:     General: No focal deficit present.     Mental Status: She is alert and oriented to person, place, and time.     Comments: CN 2-12 grossly intact, PERRLA, EOMI  Psychiatric:        Mood and Affect: Mood normal.        Behavior: Behavior normal.        Thought Content: Thought content normal.        Judgment: Judgment normal.     UC Treatments / Results  Labs (all labs ordered are listed, but only abnormal results are displayed) Labs Reviewed  CULTURE, GROUP A STREP [facility]  POCT RAPID STREP A, ED / UC    EKG   Radiology No results found.  Procedures Procedures (including critical care time)  Medications Ordered in UC Medications - No data to display  Initial Impression / Assessment and Plan / UC Course  I have reviewed the triage vital signs and the nursing notes.  Pertinent labs & imaging results that were available during my care of the patient were reviewed by me and considered in my medical decision making (see chart for details).     This patient is a very pleasant 35 y.o. year old female presenting with suspected strap pharyngitis. Afebrile, borderline tachycardic.   Centor score 3. Rapid strep negative. I suspect this was a false negative, as I had significant difficulty collecting sample from this patient. Culture sent. Discussed risks and benefits of treatment presumptively for strep pharyngitis vs waiting for culture. Following discussion of risks and  benefits, patient elects to proceed with treatment with amoxillin and viscous lidocaine as below. States she is not pregnant or breastfeeding.   ED return precautions discussed. Patient verbalizes understanding and agreement.   Level 4 for acute illness with systemic symptoms (tachycardia) and prescription drug management.  Final Clinical Impressions(s) / UC Diagnoses   Final diagnoses:  Strep pharyngitis  Screening for streptococcal infection     Discharge Instructions      -Start the antibiotic-Amoxicillin, 1 pill every 12 hours for 10 days.  You can take this with food like with breakfast and dinner. -For sore throat, use lidocaine mouthwash up to every 4 hours. Make sure not to eat for at least 1 hour after using this, as your mouth will be very numb and you could bite yourself. -You can continue tylenol/ibuprofen for discomfort, and make sure to drink plenty of fluids -You'll still be contagious for 24 hours after starting the antibiotic. This means you can go back to work in 1 day.  -Make sure to throw out your toothbrush after 24 hours so you don't give the strep back to yourself.  -Seek additional medical attention if symptoms are getting worse instead of better- trouble swallowing, shortness of breath, voice changes, etc.      ED Prescriptions     Medication Sig Dispense Auth. Provider   amoxicillin (AMOXIL) 500 MG capsule Take 1 capsule (500 mg total) by mouth 2 (two) times daily for 10 days. 20 capsule Ignacia BayleyGraham, Sayer Masini E, PA-C   lidocaine (XYLOCAINE) 2 % solution Use as directed 15 mLs in the mouth or throat as needed for mouth pain. 100 mL Rhys MartiniGraham, Matia Zelada E, PA-C      PDMP not reviewed this encounter.  Rhys Martini, PA-C 02/12/21 1356

## 2021-02-12 NOTE — Discharge Instructions (Addendum)
-  Start the antibiotic-Amoxicillin, 1 pill every 12 hours for 10 days.  You can take this with food like with breakfast and dinner. °-For sore throat, use lidocaine mouthwash up to every 4 hours. Make sure not to eat for at least 1 hour after using this, as your mouth will be very numb and you could bite yourself. °-You can continue tylenol/ibuprofen for discomfort, and make sure to drink plenty of fluids °-You'll still be contagious for 24 hours after starting the antibiotic. This means you can go back to work in 1 day.  °-Make sure to throw out your toothbrush after 24 hours so you don't give the strep back to yourself.  °-Seek additional medical attention if symptoms are getting worse instead of better- trouble swallowing, shortness of breath, voice changes, etc. ° °

## 2021-02-14 ENCOUNTER — Encounter (HOSPITAL_COMMUNITY): Payer: Self-pay

## 2021-02-14 ENCOUNTER — Ambulatory Visit (HOSPITAL_COMMUNITY)
Admission: EM | Admit: 2021-02-14 | Discharge: 2021-02-14 | Disposition: A | Discharge status: Home / Self Care | Payer: 59 | Attending: Emergency Medicine | Admitting: Emergency Medicine

## 2021-02-14 ENCOUNTER — Other Ambulatory Visit: Payer: Self-pay

## 2021-02-14 DIAGNOSIS — L270 Generalized skin eruption due to drugs and medicaments taken internally: Secondary | ICD-10-CM | POA: Diagnosis not present

## 2021-02-14 LAB — POCT INFECTIOUS MONO SCREEN, ED / UC: Mono Screen: NEGATIVE

## 2021-02-14 MED ORDER — IBUPROFEN 600 MG PO TABS: 600.0000 mg | ORAL_TABLET | Freq: Four times a day (QID) | ORAL | 0 refills | Status: AC | PRN

## 2021-02-14 MED ORDER — FLUTICASONE PROPIONATE 50 MCG/ACT NA SUSP: 2.0000 | Freq: Every day | NASAL | 0 refills | Status: DC

## 2021-02-14 NOTE — ED Triage Notes (Signed)
Pt presents with a rash all over her body. States the rash itches and burns when she takes a shower. Pt states she was prescribed Amoxicillin and after taking it, she developed the rash.

## 2021-02-14 NOTE — Discharge Instructions (Addendum)
This is most likely a drug rash from the amoxicillin.  Stop the amoxicillin immediately.  I am going to prescribe antibiotics if your throat culture comes back positive for an infection requiring antibiotics.  Mono is negative.  May take 600 mg of ibuprofen combined with 1000 mg of Tylenol together 3-4 times a day as needed for pain.  Saline nasal irrigation, NeilMed sinus rinse with distilled water as often as you want for the nasal congestion.  This will help prevent a bacterial sinus infection.  If the rash changes, gets worse, if your headache gets worse and you are unable to move your neck, then go immediately to the emergency department  Below is a list of primary care practices who are taking new patients for you to follow-up with.  [facility] internal medicine clinic Ground Floor - [facility], [address] [place], [place], [place] [address] [phone]  [facility] Primary Care at [facility] [address] [place], [place] [address] [phone]  [facility] [address] [place], [place] [address] [phone]  [facility] [phone] [address] [place] [address]  [facility]: [address] [place] [address]  [phone]  [facility]: [address] [place] [address]  [phone]  [place] primary care : [address] [place] [address] [phone]  [facility] Primary Care: [address] [place] [address] [phone]  [facility]: [address] [place] [address] [phone]  Dr. Oneal Grout [address] [facility] [address] [place] [address]  [phone]  Go to www.goodrx.com  or www.costplusdrugs.com to look up your medications. This will give you a list of where you can  find your prescriptions at the most affordable prices. Or ask the pharmacist what the cash price is, or if they have any other discount programs available to help make your medication more affordable. This can be less expensive than what you would pay with insurance.

## 2021-02-14 NOTE — ED Provider Notes (Signed)
HPI  SUBJECTIVE:  [name] is a 35 y.o. female who presents with a flat, macular rash on the palms of the hands, extremities and trunk starting yesterday.  Was started on amoxicillin 2 days ago for exudative pharyngitis.  Rapid strep negative, culture pending.  She states that the rash burns when she comes in the contact with water.  It does not itch.  She has had 4 to 5 days of fevers T-max 102, chills, headache, nasal congestion, rhinorrhea, sore throat, mild cough, raspy voice.  She states that her sore throat is getting better, but has a persistent headache.  She has been taking viscous lidocaine Tylenol/ibuprofen for the sore throat with improvement in her symptoms.  Symptoms are worse with talking, swallowing, cough.  No known COVID or mono exposure.  She had the second COVID-vaccine. Patient returned from [place], [place] 3 weeks ago.  No known exposure to monkeypox.  Past medical history of BMI above 40, gestational diabetes.  No history of mono, COVID, frequent strep.  LMP: Last week.  Denies the possibility of being pregnant.  PMD: None.  Past Medical History:  Diagnosis Date   Back pain    Fetal macrosomia 04/06/2017     Largest prior was 9lbs 8oz.   Gestational diabetes    Meds & diet controlled   History of cesarean delivery 04/30/2017    Past Surgical History:  Procedure Laterality Date   CESAREAN SECTION     CESAREAN SECTION N/A 04/29/2017   Procedure: CESAREAN SECTION;  Surgeon: Federico Flake, MD;  Location: [facility] BIRTHING SUITES;  Service: Obstetrics;  Laterality: N/A;   CHOLECYSTECTOMY     WISDOM TOOTH EXTRACTION  2019    Family History  Problem Relation Age of Onset   Hypertension Mother     Social History   Tobacco Use   Smoking status: Never   Smokeless tobacco: Never  Vaping Use   Vaping Use: Never used  Substance Use Topics   Alcohol use: No   Drug use: No    No current facility-administered medications for this encounter.  Current  Outpatient Medications:    fluticasone (FLONASE) 50 MCG/ACT nasal spray, Place 2 sprays into both nostrils daily., Disp: 16 g, Rfl: 0   ibuprofen (ADVIL) 600 MG tablet, Take 1 tablet (600 mg total) by mouth every 6 (six) hours as needed., Disp: 30 tablet, Rfl: 0   lidocaine (XYLOCAINE) 2 % solution, Use as directed 15 mLs in the mouth or throat as needed for mouth pain., Disp: 100 mL, Rfl: 0  No Known Allergies   ROS  As noted in HPI.   Physical Exam  BP 119/68 (BP Location: Right Arm)   Pulse 90   Temp 97.9 F (36.6 C)   Resp 18   LMP 01/30/2021 (Approximate)   SpO2 98%   Constitutional: Well developed, well nourished, no acute distress Eyes:  EOMI, conjunctiva normal bilaterally HENT: Normocephalic, atraumatic,mucus membranes moist.  Positive nasal congestion.  Difficulty fully visualizing oropharynx.  Partially able to visualize tonsils, enlarged, erythematous, no appreciable exudates. Neck: No appreciable anterior posterior cervical lymphadenopathy.  No meningismus. Respiratory: Normal inspiratory effort Cardiovascular: Normal rate, regular rhythm, no murmurs GI: nondistended, no splenomegaly Skin: Flat, blanchable nontender rash on the palms the hands, forearms, distal lower extremities.  Did not completely undress due to culture.               Musculoskeletal: no deformities Neurologic: Alert & oriented x 3, no focal neuro deficits Psychiatric: Speech  and behavior appropriate   ED Course   Medications - No data to display  Orders Placed This Encounter  Procedures   POCT Infectious Mono Screen    Standing Status:   Standing    Number of Occurrences:   1    Results for orders placed or performed during the hospital encounter of 02/14/21 (from the past 24 hour(s))  POCT Infectious Mono Screen     Status: None   Collection Time: 02/14/21  2:29 PM  Result Value Ref Range   Mono Screen NEGATIVE NEGATIVE   No results found.  ED Clinical  Impression  1. Drug rash      ED Assessment/Plan  Previous records reviewed.  As noted in HPI.  Patient appears nontoxic.  Her mono, surprisingly is negative.  Presentation consistent with a drug rash.  she has a macular, confluent rash on her hands and extremities, that I saw.  None on her face..  She did return from [place] 3 weeks ago, however she has no known exposure to monkey pox.  She does not have any lesions that I could swab at this time.  We will have her discontinue the amoxicillin, and prescribe further antibiotics based on the results of the throat swab.  Flonase, saline nasal irrigation for the nasal congestion.   Tylenol combined with ibuprofen, continue viscous lidocaine.  She is to go to the ED if the rash changes, headache gets worse, or for other concerns.  Will provide primary care list for ongoing care and order assistance in finding a PMD.  Discussed labs,  MDM, treatment plan, and plan for follow-up with patient. Discussed sn/sx that should prompt return to the ED. patient agrees with plan.   Meds ordered this encounter  Medications   ibuprofen (ADVIL) 600 MG tablet    Sig: Take 1 tablet (600 mg total) by mouth every 6 (six) hours as needed.    Dispense:  30 tablet    Refill:  0   fluticasone (FLONASE) 50 MCG/ACT nasal spray    Sig: Place 2 sprays into both nostrils daily.    Dispense:  16 g    Refill:  0       *This clinic note was created using Scientist, clinical (histocompatibility and immunogenetics). Therefore, there may be occasional mistakes despite careful proofreading.  ?    Domenick Gong, MD 02/15/21 [phone]

## 2021-02-15 LAB — CULTURE, GROUP A STREP (THRC)

## 2023-06-17 ENCOUNTER — Ambulatory Visit (HOSPITAL_COMMUNITY)
Admission: EM | Admit: 2023-06-17 | Discharge: 2023-06-17 | Disposition: A | Discharge status: Home / Self Care | Payer: 59 | Attending: Emergency Medicine | Admitting: Emergency Medicine

## 2023-06-17 ENCOUNTER — Encounter (HOSPITAL_COMMUNITY): Payer: Self-pay | Admitting: *Deleted

## 2023-06-17 DIAGNOSIS — N939 Abnormal uterine and vaginal bleeding, unspecified: Secondary | ICD-10-CM | POA: Diagnosis present

## 2023-06-17 LAB — POCT URINALYSIS DIP (MANUAL ENTRY)
Glucose, UA: NEGATIVE mg/dL
Ketones, POC UA: NEGATIVE mg/dL
Nitrite, UA: NEGATIVE
Protein Ur, POC: >=300 mg/dL — AB
Spec Grav, UA: >=1.03 — AB (ref 1.010–1.025)
Urobilinogen, UA: 0.2 U/dL
pH, UA: 5 (ref 5.0–8.0)

## 2023-06-17 LAB — BASIC METABOLIC PANEL
Anion gap: 12 (ref 5–15)
BUN: 9 mg/dL (ref 6–20)
CO2: 19 mmol/L — ABNORMAL LOW (ref 22–32)
Calcium: 9.2 mg/dL (ref 8.9–10.3)
Chloride: 105 mmol/L (ref 98–111)
Creatinine, Ser: 0.51 mg/dL (ref 0.44–1.00)
GFR, Estimated: >60 mL/min (ref >60)
Glucose, Bld: 68 mg/dL — ABNORMAL LOW (ref 70–99)
Potassium: 5.6 mmol/L — ABNORMAL HIGH (ref 3.5–5.1)
Sodium: 136 mmol/L (ref 135–145)

## 2023-06-17 LAB — CBC
HCT: 36.7 % (ref 36.0–46.0)
Hemoglobin: 12.1 g/dL (ref 12.0–15.0)
MCH: 28.6 pg (ref 26.0–34.0)
MCHC: 33 g/dL (ref 30.0–36.0)
MCV: 86.8 fL (ref 80.0–100.0)
Platelets: 356 10*3/uL (ref 150–400)
RBC: 4.23 MIL/uL (ref 3.87–5.11)
RDW: 13.9 % (ref 11.5–15.5)
WBC: 4.9 10*3/uL (ref 4.0–10.5)
nRBC: 0 % (ref 0.0–0.2)

## 2023-06-17 LAB — POCT URINE PREGNANCY: Preg Test, Ur: NEGATIVE

## 2023-06-17 MED ORDER — MEDROXYPROGESTERONE ACETATE 10 MG PO TABS: 10.0000 mg | ORAL_TABLET | Freq: Every day | ORAL | 0 refills | Status: DC

## 2023-06-17 NOTE — Discharge Instructions (Addendum)
You are having prolonged, heavy vaginal bleeding.  We are checking some basic lab work, call you if anything is emergent.  Take the Provera once daily for the next 10 days to help stop the vaginal bleeding.  You can continue to take ibuprofen, either 600 or 800 mg every 8 hours.  You can take Tylenol 500 mg for any breakthrough pain.  Please follow-up with your OB/GYN for further evaluation of your vaginal bleeding.  Seek immediate care at the nearest emergency department if you develop loss of consciousness, dizziness, worsening of the vaginal bleeding despite intervention, or any new concerning symptoms.

## 2023-06-17 NOTE — ED Triage Notes (Signed)
Pt states she has had low back pain X 2 weeks, she is taking IBU as needed when it is more painful.She took it today before she came to clinic.   She complains of menstrual bleeding for 4 weeks which is abnormal for her. She does have a IUD (paraguard), she called her GYN they can't see her until Jan 26.

## 2023-06-17 NOTE — ED Provider Notes (Signed)
[facility]    CSN: [id number] Arrival date & time: 06/17/23  0809      History   Chief Complaint Chief Complaint  Patient presents with   Back Pain   menstrual issue    HPI [name] is a 37 y.o. female.   Patient presents to clinic for vaginal bleeding that started November 18.  Initially it started with light bleeding, noticed some blood when she was wiping.  Over the past 3 weeks her bleeding has been very heavy.  Reports she is saturating a maxi pad and changing this every 30 minutes.  Denies any dizziness or loss of consciousness.  She is having bilateral lower back pain.  No dysuria.  She has a ParaGard IUD, copper IUD was placed December 2018.  Reports this has happened 1 time to her in the past and she had to have her IUD removed, she subsequently got pregnant with her fourth child.  Reports she does not want her IUD removed.  Denies any abdominal pain.  No nausea or vomiting.  No fevers.      The history is provided by the patient and medical records.  Back Pain   Past Medical History:  Diagnosis Date   Back pain    Fetal macrosomia 04/06/2017     Largest prior was 9lbs 8oz.   Gestational diabetes    Meds & diet controlled   History of cesarean delivery 04/30/2017    Patient Active Problem List   Diagnosis Date Noted   Acanthosis nigricans 10/16/2020   Women's annual routine gynecological examination 10/16/2020   BMI 40.0-44.9, adult (HCC) 04/06/2017   History of gestational diabetes 03/16/2017    Past Surgical History:  Procedure Laterality Date   CESAREAN SECTION     CESAREAN SECTION N/A 04/29/2017   Procedure: CESAREAN SECTION;  Surgeon: Federico Flake, MD;  Location: [facility] BIRTHING SUITES;  Service: Obstetrics;  Laterality: N/A;   CHOLECYSTECTOMY     WISDOM TOOTH EXTRACTION  2019    OB History     Gravida  4   Para  4   Term  4   Preterm  0   AB  0   Living  4      SAB  0   IAB  0   Ectopic  0    Multiple  0   Live Births  3            Home Medications    Prior to Admission medications   Medication Sig Start Date End Date Taking? Authorizing Provider  ibuprofen (ADVIL) 600 MG tablet Take 1 tablet (600 mg total) by mouth every 6 (six) hours as needed. 02/14/21  Yes Domenick Gong, MD  medroxyPROGESTERone (PROVERA) 10 MG tablet Take 1 tablet (10 mg total) by mouth daily for 10 days. 06/17/23 06/27/23 Yes Rinaldo Ratel, [place] N, FNP  fluticasone [facility]) 50 MCG/ACT nasal spray Place 2 sprays into both nostrils daily. 02/14/21   Domenick Gong, MD  lidocaine (XYLOCAINE) 2 % solution Use as directed 15 mLs in the mouth or throat as needed for mouth pain. 02/12/21   Rhys Martini, PA-C    Family History Family History  Problem Relation Age of Onset   Hypertension Mother     Social History Social History   Tobacco Use   Smoking status: Never   Smokeless tobacco: Never  Vaping Use   Vaping status: Never Used  Substance Use Topics   Alcohol use: No   Drug use:  No     Allergies   Patient has no known allergies.   Review of Systems Review of Systems  Per HPI   Physical Exam Triage Vital Signs ED Triage Vitals  Encounter Vitals Group     BP 06/17/23 0832 (!) 146/104     Systolic BP Percentile --      Diastolic BP Percentile --      Pulse Rate 06/17/23 0832 77     Resp 06/17/23 0832 18     Temp 06/17/23 0832 98.2 F (36.8 C)     Temp Source 06/17/23 0832 Oral     SpO2 06/17/23 0832 97 %     Weight --      Height --      Head Circumference --      Peak Flow --      Pain Score 06/17/23 0830 9     Pain Loc --      Pain Education --      Exclude from Growth Chart --    No data found.  Updated Vital Signs BP (!) 146/104 (BP Location: Right Arm)   Pulse 77   Temp 98.2 F (36.8 C) (Oral)   Resp 18   LMP 05/25/2023 (Exact Date)   SpO2 97%   Breastfeeding No   Visual Acuity Right Eye Distance:   Left Eye Distance:   Bilateral Distance:     Right Eye Near:   Left Eye Near:    Bilateral Near:     Physical Exam Vitals and nursing note reviewed.  Constitutional:      Appearance: Normal appearance.  HENT:     Head: Normocephalic and atraumatic.     Right Ear: External ear normal.     Left Ear: External ear normal.     Nose: Nose normal.     Mouth/Throat:     Mouth: Mucous membranes are moist.  Eyes:     Conjunctiva/sclera: Conjunctivae normal.  Cardiovascular:     Rate and Rhythm: Normal rate and regular rhythm.     Heart sounds: Normal heart sounds. No murmur heard. Pulmonary:     Effort: Pulmonary effort is normal. No respiratory distress.     Breath sounds: Normal breath sounds.  Abdominal:     Tenderness: There is no right CVA tenderness or left CVA tenderness.  Musculoskeletal:        General: Normal range of motion.  Skin:    General: Skin is warm and dry.  Neurological:     General: No focal deficit present.     Mental Status: She is alert and oriented to person, place, and time.  Psychiatric:        Mood and Affect: Mood normal.        Behavior: Behavior normal.      UC Treatments / Results  Labs (all labs ordered are listed, but only abnormal results are displayed) Labs Reviewed  POCT URINALYSIS DIP (MANUAL ENTRY) - Abnormal; Notable for the following components:      Result Value   Color, UA red (*)    Clarity, UA cloudy (*)    Bilirubin, UA small (*)    Spec Grav, UA >=1.030 (*)    Blood, UA large (*)    Protein Ur, POC >=300 (*)    Leukocytes, UA Trace (*)    All other components within normal limits  URINE CULTURE  BASIC METABOLIC PANEL  CBC  POCT URINE PREGNANCY    EKG   Radiology No results  found.  Procedures Procedures (including critical care time)  Medications Ordered in UC Medications - No data to display  Initial Impression / Assessment and Plan / UC Course  I have reviewed the triage vital signs and the nursing notes.  Pertinent labs & imaging results that  were available during my care of the patient were reviewed by me and considered in my medical decision making (see chart for details).  Vitals and triage reviewed, patient is hemodynamically stable.  Negative for CVA tenderness.  Vitals are stable.  Having heavy prolonged vaginal bleeding, will check CBC and BMP.  Urinalysis shows small leukocytes and other abnormalities, will send for culture to ensure no UTI.  Urine pregnancy negative, no history of DVT and patient does not smoke.  Will start Provera for 10 days to help stop the bleeding.  Risk versus benefit discussion with patient, discussed potential of heading to the emergency department for advanced imaging due to saturating a pad every 30 minutes for the past 3 weeks.  Patient declined and would like labs done in clinic and medication to stop the bleeding.  Discussed strict emergency precautions.  Patient has a follow-up appointment scheduled with her OB/GYN January 26.  Plan of care, follow-up care return precautions given, no questions at this time.     Final Clinical Impressions(s) / UC Diagnoses   Final diagnoses:  Abnormal uterine bleeding     Discharge Instructions      You are having prolonged, heavy vaginal bleeding.  We are checking some basic lab work, call you if anything is emergent.  Take the Provera once daily for the next 10 days to help stop the vaginal bleeding.  You can continue to take ibuprofen, either 600 or 800 mg every 8 hours.  You can take Tylenol 500 mg for any breakthrough pain.  Please follow-up with your OB/GYN for further evaluation of your vaginal bleeding.  Seek immediate care at the nearest emergency department if you develop loss of consciousness, dizziness, worsening of the vaginal bleeding despite intervention, or any new concerning symptoms.      ED Prescriptions     Medication Sig Dispense Auth. Provider   medroxyPROGESTERone (PROVERA) 10 MG tablet Take 1 tablet (10 mg total) by mouth daily for  10 days. 10 tablet Cayne Yom, [place] N, FNP      PDMP not reviewed this encounter.   Rinaldo Ratel [place] N, [place] 06/17/23 [phone]

## 2023-06-19 LAB — URINE CULTURE: Culture: 70000 — AB

## 2023-06-29 ENCOUNTER — Telehealth: Payer: Self-pay | Admitting: Family Medicine

## 2023-06-29 NOTE — Telephone Encounter (Signed)
Patient is having issues with IUD. She has been to urgent care and received a medication which she took and the bleeding got worst and she is changing her pad every 20 mins. She stated she is using the heavy pads and still having to change them often, would like a nurse to call back and see if there is anything she can do in the meantime, has an appt on Jan 23 here with [place].

## 2023-07-02 ENCOUNTER — Telehealth: Payer: Self-pay | Admitting: Family Medicine

## 2023-07-02 NOTE — Progress Notes (Addendum)
GYNECOLOGY OFFICE VISIT NOTE  History:   [name] is a 37 y.o. [phone] here today for bleeding with IUD in place. She has a paragard.   From Urgent Care on 12/11: Patient presents to clinic for vaginal bleeding that started November 18.  Initially it started with light bleeding, noticed some blood when she was wiping.  Over the past 3 weeks her bleeding has been very heavy.  Reports she is saturating a maxi pad and changing this every 30 minutes.  Denies any dizziness or loss of consciousness.  She is having bilateral lower back pain.  No dysuria.   She has a ParaGard IUD, copper IUD was placed December 2018.  Reports this has happened 1 time to her in the past and she had to have her IUD removed, she subsequently got pregnant with her fourth child.  Reports she does not want her IUD removed.   She does note want anymore children. She thinks she would like IUD removed today and may want it to be replaced at a later date. She tried the Provera from Urgent care and her bleeding was heavier with it. This happened with her last IUD back in 2017. She had it removed and then later replaced and it resolved.   The following portions of the patient's history were reviewed and updated as appropriate: allergies, current medications, past family history, past medical history, past social history, past surgical history and problem list.   Health Maintenance:   Normal pap and negative HRHPV:  Diagnosis  Date Value Ref Range Status  10/16/2020   Final   - Negative for intraepithelial lesion or malignancy (NILM)   Review of Systems:  Pertinent items noted in HPI and remainder of comprehensive ROS otherwise negative.  Physical Exam:  BP (!) 149/84   Pulse 83   Ht 5\' 7"  (1.702 m)   Wt 281 lb (127.5 kg)   LMP 05/25/2023 (Exact Date)   BMI 44.01 kg/m  CONSTITUTIONAL: Well-developed, well-nourished female in no acute distress.  HEENT:  Normocephalic, atraumatic. External right and left ear  normal. No scleral icterus.  NECK: Normal range of motion, supple, no masses noted on observation SKIN: No rash noted. Not diaphoretic. No erythema. No pallor. MUSCULOSKELETAL: Normal range of motion. No edema noted. NEUROLOGIC: Alert and oriented to person, place, and time. Normal muscle tone coordination. No cranial nerve deficit noted. PSYCHIATRIC: Normal mood and affect. Normal behavior. Normal judgment and thought content.  PELVIC: Normal appearing external genitalia; normal urethral meatus; normal appearing vaginal mucosa and cervix.  No abnormal discharge noted.  Normal uterine size, no other palpable masses, no uterine or adnexal tenderness. Performed in the presence of a chaperone   GYNECOLOGY OFFICE PROCEDURE NOTE  IUD Removal  Patient identified, informed consent performed, consent signed.  Patient was in the dorsal lithotomy position, normal external genitalia was noted.  A speculum was placed in the patient's vagina, normal discharge was noted, no lesions. The cervix was visualized, no lesions, no abnormal discharge.  The strings of the IUD were grasped and pulled using ring forceps. The IUD was removed in its entirety.   Patient tolerated the procedure well.    Assessment and Plan:    [name] was seen today for follow-up.  Diagnoses and all orders for this visit:  Encounter for IUD removal - IUD removed today per her request. May wish to have replaced at next appointment.   Menorrhagia with regular cycle Plan is for removal of IUD and check [place]  for anatomic causes of bleeding I.e. fibroids.  Will do TXA to help with HVB in the meantime Will have low threshold for EMB at next appt if [place] is normal or if bleeding is still continuing despite IUD removal -     US PELVIC COMPLETE WITH TRANSVAGINAL; Future -     tranexamic acid (LYSTEDA) 650 MG TABS tablet; Take 2 tablets (1,300 mg total) by mouth 3 (three) times daily. Take during menses for a maximum of five days -      Cervicovaginal ancillary only( [place])   Meds ordered this encounter  Medications   DISCONTD: tranexamic acid (LYSTEDA) 650 MG TABS tablet    Sig: Take 2 tablets (1,300 mg total) by mouth 3 (three) times daily. Take during menses for a maximum of five days    Dispense:  30 tablet    Refill:  2   tranexamic acid (LYSTEDA) 650 MG TABS tablet    Sig: Take 2 tablets (1,300 mg total) by mouth 3 (three) times daily. Take during menses for a maximum of five days    Dispense:  30 tablet    Refill:  2     Routine preventative health maintenance measures emphasized. Please refer to After Visit Summary for other counseling recommendations.   Return in about 2 weeks (around 07/17/2023) for Follow up US, possible paragard insertion.  Milas Hock, MD, FACOG Obstetrician & Gynecologist, [facility], [facility]

## 2023-07-02 NOTE — Telephone Encounter (Signed)
Called pt; VM left stating I am returning her phone call and will also send MyChart message (she was last active on MyChart today, 07/02/23).

## 2023-07-03 ENCOUNTER — Other Ambulatory Visit: Payer: Self-pay

## 2023-07-03 ENCOUNTER — Ambulatory Visit (INDEPENDENT_AMBULATORY_CARE_PROVIDER_SITE_OTHER): Payer: Medicaid Other | Admitting: Obstetrics and Gynecology

## 2023-07-03 ENCOUNTER — Encounter: Payer: Self-pay | Admitting: Obstetrics and Gynecology

## 2023-07-03 VITALS — BP 115/86 | HR 76 | Ht 67.0 in | Wt 281.0 lb

## 2023-07-03 DIAGNOSIS — N920 Excessive and frequent menstruation with regular cycle: Secondary | ICD-10-CM

## 2023-07-03 DIAGNOSIS — Z975 Presence of (intrauterine) contraceptive device: Secondary | ICD-10-CM

## 2023-07-03 DIAGNOSIS — N926 Irregular menstruation, unspecified: Secondary | ICD-10-CM

## 2023-07-03 DIAGNOSIS — Z30432 Encounter for removal of intrauterine contraceptive device: Secondary | ICD-10-CM

## 2023-07-03 MED ORDER — TRANEXAMIC ACID 650 MG PO TABS: 1300.0000 mg | ORAL_TABLET | Freq: Three times a day (TID) | ORAL | 2 refills | Status: DC

## 2023-07-03 NOTE — Addendum Note (Signed)
Addended by: Milas Hock A on: 07/03/2023 11:15 AM   Modules accepted: Orders

## 2023-07-10 ENCOUNTER — Ambulatory Visit (HOSPITAL_COMMUNITY)
Admission: RE | Admit: 2023-07-10 | Discharge: 2023-07-10 | Disposition: A | Discharge status: Home / Self Care | Payer: 59 | Source: Ambulatory Visit | Attending: Obstetrics and Gynecology | Admitting: Obstetrics and Gynecology

## 2023-07-10 DIAGNOSIS — N920 Excessive and frequent menstruation with regular cycle: Secondary | ICD-10-CM | POA: Insufficient documentation

## 2023-07-14 ENCOUNTER — Encounter: Payer: Self-pay | Admitting: Obstetrics and Gynecology

## 2023-07-19 NOTE — Progress Notes (Signed)
 GYNECOLOGY OFFICE VISIT NOTE  History:   [name] is a 38 y.o. [id number] here today for follow up from her vaginal bleeding. She had her Paragard  removed which helped and I also prescribed TXA.   She had an US  which was normal. She had a left simple 3.5 cm cyst that does not require follow up as this time. Uterus was 10 cm and anteverted.   She did have one episode of unprotected intercourse 3 days ago (withdrawal) but otherwise used condoms every time. She would like another Paragard .    Past Medical History:  Diagnosis Date   Back pain    Fetal macrosomia 04/06/2017     Largest prior was 9lbs 8oz.   Gestational diabetes    Meds & diet controlled   History of cesarean delivery 04/30/2017    Past Surgical History:  Procedure Laterality Date   CESAREAN SECTION     CESAREAN SECTION N/A 04/29/2017   Procedure: CESAREAN SECTION;  Surgeon: Abner Ables, MD;  Location: [facility]IRTH[facility] SU[facility];  Service: Obstetrics;  Laterality: N/A;   CHOLECYSTECTOMY     WISDOM TOOTH EXTRACTION  2019    The following portions of the patient's history were reviewed and updated as appropriate: allergies, current medications, past family history, past medical history, past social history, past surgical history and problem list.   Health Maintenance:   Normal pap and negative HRHPV on 10/2020.   Diagnosis  Date Value Ref Range Status  10/16/2020   Final   - Negative for intraepithelial lesion or malignancy (NILM)   Review of Systems:  Pertinent items noted in HPI and remainder of comprehensive ROS otherwise negative.  Physical Exam:  BP 113/79   Pulse 76   Ht 5\' 7"  (1.702 m)   Wt 282 lb (127.9 kg)   BMI 44.17 kg/m  CONSTITUTIONAL: Well-developed, well-nourished female in no acute distress.  HEENT:  Normocephalic, atraumatic. External right and left ear normal. No scleral icterus.  NECK: Normal range of motion, supple, no masses noted on observation SKIN: No rash noted. Not  diaphoretic. No erythema. No pallor. MUSCULOSKELETAL: Normal range of motion. No edema noted. NEUROLOGIC: Alert and oriented to person, place, and time. Normal muscle tone coordination. No cranial nerve deficit noted. PSYCHIATRIC: Normal mood and affect. Normal behavior. Normal judgment and thought content.  PELVIC: Normal appearing external genitalia; normal urethral meatus; normal appearing vaginal mucosa and cervix.  No abnormal discharge noted.  Normal uterine size, no other palpable masses, no uterine or adnexal tenderness. Performed in the presence of a chaperone  Labs and Imaging Results for orders placed or performed in visit on 07/22/23 (from the past week)  Pregnancy, urine POC   Collection Time: 07/22/23  2:50 PM  Result Value Ref Range   Preg Test, Ur NEGATIVE NEGATIVE   US  PELVIC COMPLETE WITH TRANSVAGINAL Result Date: 07/14/2023 : PROCEDURE: US  PELVIS COMPLETE WITH TRANSVAGINAL HISTORY: Patient is a 38 y/o  F with heavy bleeding.  Unsure of LMP. COMPARISON: U/S transvaginal 04/03/2016. TECHNIQUE: Two-dimensional transvaginal grayscale and color Doppler ultrasound of the pelvis was performed. FINDINGS: The uterus is anteverted in position and measures 10.2 x 5.5 x 7.5 cm. It demonstrates a normal, homogeneous echotexture. The endometrium measures 0.8 cm and demonstrates a normal homogeneous echotexture. Nabothian cysts are visualized within the cervix. The right ovary measures 3.4 x 3.2 x 3.4 cm and demonstrates a normal echotexture with a 2.5 cm dominant anechoic follicle. There is normal color Doppler flow. The left ovary measures  4.7 x 3.7 x 4.5 cm and demonstrates a normal echotexture with a 3.5 cm anechoic cyst. There is normal color Doppler flow. There is no fluid present within the cul-de-sac. IMPRESSION: 1. Left ovarian 3.5 cm simple cyst. This is likely a benign finding and follow up is recommended as clinically indicated. Thank you for allowing us  to assist in the care of this  patient. Electronically Signed   By: Beula Brunswick M.D.   On: 07/14/2023 08:16     GYNECOLOGY OFFICE PROCEDURE NOTE  [name] is a 38 y.o. [id number] here for IUD insertion.   IUD Insertion Procedure Note Procedure: IUD insertion with Paragard  UPT: Negative  Patient identified.  Risks, benefits and alternatives of procedure were discussed including irregular bleeding, cramping, infection, malpositioning or misplacement of the IUD outside the uterus which may require further procedure such as laparoscopy. Also discussed >99% contraception efficacy, increased risk of ectopic pregnancy with failure of method.   Emphasized that this did not protect against STIs, condoms recommended during all sexual encounters. Consent signed. Time out performed.   Speculum inserted and cervix visualized, prepped with 3 swabs of betadine.   Uterus sounded to 9 cm and IUD then inserted without difficulty per manufacturer's instructions and strings cut to 3 cm below cervical os and all instruments removed. Pt tolerated well with minimal pain and bleeding.   IUD insertion performed by Dr. Daisey Dryer.   Assessment and Plan:   1. Menorrhagia with regular cycle (Primary) Normal pelvic US . Can continue TXA if working well for her.   2. Encounter for IUD insertion Discussed Paragard  in that setting being emergency contraception since within 5 days Recommended UPT to be checked at home in 2 weeks to ensure not pregnant. Pt voices understanding. She would prefer IUD today and will check UPT at home.    No orders of the defined types were placed in this encounter.    Routine preventative health maintenance measures emphasized. Please refer to After Visit Summary for other counseling recommendations.   Return if symptoms worsen or fail to improve.  Lacey Pian, MD, FACOG Obstetrician & Gynecologist, [facility] for [facility], [facility]

## 2023-07-22 ENCOUNTER — Ambulatory Visit: Payer: Medicaid Other | Admitting: Obstetrics and Gynecology

## 2023-07-22 ENCOUNTER — Encounter: Payer: Self-pay | Admitting: Obstetrics and Gynecology

## 2023-07-22 ENCOUNTER — Other Ambulatory Visit: Payer: Self-pay

## 2023-07-22 VITALS — BP 113/79 | HR 76 | Ht 67.0 in | Wt 282.0 lb

## 2023-07-22 DIAGNOSIS — Z30430 Encounter for insertion of intrauterine contraceptive device: Secondary | ICD-10-CM

## 2023-07-22 DIAGNOSIS — N920 Excessive and frequent menstruation with regular cycle: Secondary | ICD-10-CM | POA: Diagnosis not present

## 2023-07-22 DIAGNOSIS — Z3202 Encounter for pregnancy test, result negative: Secondary | ICD-10-CM | POA: Diagnosis not present

## 2023-07-22 LAB — POCT PREGNANCY, URINE: Preg Test, Ur: NEGATIVE

## 2023-07-22 MED ORDER — PARAGARD INTRAUTERINE COPPER IU IUD
1.0000 | INTRAUTERINE_SYSTEM | Freq: Once | INTRAUTERINE | Status: AC
  Administered 2023-07-22: 1 via INTRAUTERINE

## 2023-07-22 NOTE — Addendum Note (Signed)
 Addended by: Leander Prior on: 07/22/2023 03:58 PM   Modules accepted: Orders

## 2023-07-30 ENCOUNTER — Ambulatory Visit: Payer: 59 | Admitting: Obstetrics and Gynecology

## 2023-09-17 ENCOUNTER — Telehealth: Payer: Self-pay | Admitting: Family Medicine

## 2023-09-17 DIAGNOSIS — N926 Irregular menstruation, unspecified: Secondary | ICD-10-CM

## 2023-09-17 NOTE — Telephone Encounter (Signed)
 Patient called stating she has been bleeding , took medication that she was prescribed earlier in the year for bleeding and the bleeding got worst. Would like a nurse to call back to see what her options are, if she would have to get her IUD out, if she has to see a doctor or can she be prescribed another medication.

## 2023-09-18 MED ORDER — MEDROXYPROGESTERONE ACETATE 10 MG PO TABS: 10.0000 mg | ORAL_TABLET | Freq: Every day | ORAL | 0 refills | Status: AC

## 2023-09-18 NOTE — Telephone Encounter (Signed)
 I reviewed patient complaints and history with [name] and she advised patient may be offered Rx for Provera for 10 days and route chart to Dr. Para March for further recommendations. I called [name] and notified her of recommendation and that once we hear from Dr. Para March next week we would get back to her.  She would like RX sent. I also reviewed bleeding precautions with her. She voices understanding.  Nancy Fetter

## 2023-09-18 NOTE — Telephone Encounter (Signed)
 I called patient after being notified by front desk she is waiting for someone to call her and worried about bleeding. I called [name] and we discussed she had an IUD before and it was in 6 years then started having bleeding and it was removed in December 2024 and she took a medication for a few days. Then on 07/22/23 had  a Paragard inserted. States  no bleeding for 4 weeks then on 08/22/23 started bleeding and is still bleeding. States she changes her pad about every 30-40 minutes and it is about 1/2 filled. States she took the Tranexamic acid 09/09/23-09/13/23 and it helped decrease bleeding while taking but then bleeding increased again. States she sees some small clots in commode and is lightheaded sometimes. I informed her I would discuss with provider and call her back.  Raynald Blend, RN

## 2023-09-21 NOTE — Telephone Encounter (Signed)
 Left message for pt that I am calling with f/u to please give the office a call back.    Addison Naegeli, RN

## 2023-09-23 NOTE — Telephone Encounter (Signed)
 I called and left a message our provider would like her to schedule a follow up re: what we discussed previously and she may call front office to schedule. Message also sent to front desk to schedule gyn appt.  Nancy Fetter

## 2023-09-30 ENCOUNTER — Emergency Department (HOSPITAL_COMMUNITY)
Admission: EM | Admit: 2023-09-30 | Discharge: 2023-10-01 | Disposition: A | Discharge status: Home / Self Care | Attending: Emergency Medicine | Admitting: Emergency Medicine

## 2023-09-30 ENCOUNTER — Encounter (HOSPITAL_COMMUNITY): Payer: Self-pay | Admitting: Emergency Medicine

## 2023-09-30 ENCOUNTER — Other Ambulatory Visit: Payer: Self-pay

## 2023-09-30 ENCOUNTER — Ambulatory Visit (HOSPITAL_COMMUNITY)
Admission: EM | Admit: 2023-09-30 | Discharge: 2023-09-30 | Disposition: A | Discharge status: Home / Self Care | Attending: Emergency Medicine | Admitting: Emergency Medicine

## 2023-09-30 DIAGNOSIS — N939 Abnormal uterine and vaginal bleeding, unspecified: Secondary | ICD-10-CM | POA: Diagnosis not present

## 2023-09-30 LAB — COMPREHENSIVE METABOLIC PANEL
ALT: 15 U/L (ref 0–44)
AST: 18 U/L (ref 15–41)
Albumin: 3.9 g/dL (ref 3.5–5.0)
Alkaline Phosphatase: 60 U/L (ref 38–126)
Anion gap: 5 (ref 5–15)
BUN: 8 mg/dL (ref 6–20)
CO2: 26 mmol/L (ref 22–32)
Calcium: 9.1 mg/dL (ref 8.9–10.3)
Chloride: 107 mmol/L (ref 98–111)
Creatinine, Ser: 0.58 mg/dL (ref 0.44–1.00)
GFR, Estimated: >60 mL/min (ref >60)
Glucose, Bld: 91 mg/dL (ref 70–99)
Potassium: 3.9 mmol/L (ref 3.5–5.1)
Sodium: 138 mmol/L (ref 135–145)
Total Bilirubin: 1.3 mg/dL — ABNORMAL HIGH (ref 0.0–1.2)
Total Protein: 7.5 g/dL (ref 6.5–8.1)

## 2023-09-30 LAB — POCT URINALYSIS DIP (MANUAL ENTRY)
Glucose, UA: NEGATIVE mg/dL
Nitrite, UA: POSITIVE — AB
Protein Ur, POC: >=300 mg/dL — AB
Spec Grav, UA: 1.02 (ref 1.010–1.025)
Urobilinogen, UA: 2 U/dL — AB
pH, UA: 5 (ref 5.0–8.0)

## 2023-09-30 LAB — CBC
HCT: 34.3 % — ABNORMAL LOW (ref 36.0–46.0)
Hemoglobin: 11.3 g/dL — ABNORMAL LOW (ref 12.0–15.0)
MCH: 28.5 pg (ref 26.0–34.0)
MCHC: 32.9 g/dL (ref 30.0–36.0)
MCV: 86.4 fL (ref 80.0–100.0)
Platelets: 390 10*3/uL (ref 150–400)
RBC: 3.97 MIL/uL (ref 3.87–5.11)
RDW: 13.2 % (ref 11.5–15.5)
WBC: 5.9 10*3/uL (ref 4.0–10.5)
nRBC: 0 % (ref 0.0–0.2)

## 2023-09-30 LAB — POCT URINE PREGNANCY: Preg Test, Ur: NEGATIVE

## 2023-09-30 LAB — LIPASE, BLOOD: Lipase: 23 U/L (ref 11–51)

## 2023-09-30 LAB — HCG, SERUM, QUALITATIVE: Preg, Serum: NEGATIVE

## 2023-09-30 NOTE — ED Triage Notes (Signed)
 Iud placed in January, 2/15 started bleeding The last few days, bleeding has worsened.  Has taken medicines as prescribed by provider, but bleeding did not stop.  Patient is having abdominal pain-"like contractions".  Describes blood clots and having to change pads every 15 minutes

## 2023-09-30 NOTE — ED Triage Notes (Signed)
 Patient reports lower abdominal pain for 2 days. Patient also reports intermittent vaginal bleeding for 6 weeks but reports that bleeding has increased this week. Advised to come to ED by OBGYN because no appointments were available.

## 2023-09-30 NOTE — ED Notes (Signed)
 Patient is being discharged from the Urgent Care and sent to the Emergency Department via POV . Per [place] Garrison, NP, patient is in need of higher level of care due to vaginal bleeding/limited resources. Patient is aware and verbalizes understanding of plan of care.  Vitals:   09/30/23 1834  BP: 120/84  Pulse: 78  Temp: 98 F (36.7 C)  SpO2: 100%

## 2023-09-30 NOTE — ED Provider Notes (Signed)
 [facility]    CSN: [id number] Arrival date & time: 09/30/23  1719      History   Chief Complaint Chief Complaint  Patient presents with   Vaginal Bleeding    HPI [name] is a 38 y.o. female.   Patient presents to clinic over concerns for prolonged heavy vaginal bleeding.  She got an IUD inserted on 1/15.  On February 15 she started with heavy vaginal bleeding.  She was started on Provera which helped the bleeding, and then when she finished the Provera she had severe heavy bleeding.  The bleeding has been severe and heavy for the past 3 days.  Has been changing her pad every 10 to 15 minutes.  Also reports today she started with urinary urgency, frequency and dysuria.  Today she has been dizzy, had to leave work early due to her symptoms and had her husband drive her to clinic.  Has contacted her OB/GYN office but they are not able to get her in until next week or the week after that.  The history is provided by the patient and medical records.  Vaginal Bleeding   Past Medical History:  Diagnosis Date   Back pain    Fetal macrosomia 04/06/2017     Largest prior was 9lbs 8oz.   Gestational diabetes    Meds & diet controlled   History of cesarean delivery 04/30/2017    Patient Active Problem List   Diagnosis Date Noted   Acanthosis nigricans 10/16/2020   BMI 40.0-44.9, adult (HCC) 04/06/2017   History of gestational diabetes 03/16/2017    Past Surgical History:  Procedure Laterality Date   CESAREAN SECTION     CESAREAN SECTION N/A 04/29/2017   Procedure: CESAREAN SECTION;  Surgeon: Federico Flake, MD;  Location: [facility] BIRTHING SUITES;  Service: Obstetrics;  Laterality: N/A;   CHOLECYSTECTOMY     WISDOM TOOTH EXTRACTION  2019    OB History     Gravida  4   Para  4   Term  4   Preterm  0   AB  0   Living  4      SAB  0   IAB  0   Ectopic  0   Multiple  0   Live Births  3            Home Medications    Prior  to Admission medications   Medication Sig Start Date End Date Taking? Authorizing Provider  ibuprofen (ADVIL) 600 MG tablet Take 1 tablet (600 mg total) by mouth every 6 (six) hours as needed. 02/14/21   Domenick Gong, MD  medroxyPROGESTERone (PROVERA) 10 MG tablet Take 1 tablet (10 mg total) by mouth daily. 09/18/23   Sue Lush, FNP    Family History Family History  Problem Relation Age of Onset   Hypertension Mother     Social History Social History   Tobacco Use   Smoking status: Never   Smokeless tobacco: Never  Vaping Use   Vaping status: Never Used  Substance Use Topics   Alcohol use: No   Drug use: No     Allergies   Other   Review of Systems Review of Systems  Per HPI  Physical Exam Triage Vital Signs ED Triage Vitals  Encounter Vitals Group     BP 09/30/23 1834 120/84     Systolic BP Percentile --      Diastolic BP Percentile --      Pulse Rate 09/30/23  1834 78     Resp --      Temp 09/30/23 1834 98 F (36.7 C)     Temp Source 09/30/23 1834 Oral     SpO2 09/30/23 1834 100 %     Weight --      Height --      Head Circumference --      Peak Flow --      Pain Score 09/30/23 1831 9     Pain Loc --      Pain Education --      Exclude from Growth Chart --    No data found.  Updated Vital Signs BP 120/84 (BP Location: Left Arm) Comment (BP Location): large cuff  Pulse 78   Temp 98 F (36.7 C) (Oral)   SpO2 100%   Visual Acuity Right Eye Distance:   Left Eye Distance:   Bilateral Distance:    Right Eye Near:   Left Eye Near:    Bilateral Near:     Physical Exam Vitals and nursing note reviewed.  Constitutional:      Appearance: Normal appearance.  HENT:     Head: Normocephalic and atraumatic.     Right Ear: External ear normal.     Left Ear: External ear normal.     Nose: Nose normal.     Mouth/Throat:     Mouth: Mucous membranes are moist.  Eyes:     Conjunctiva/sclera: Conjunctivae normal.  Cardiovascular:     Rate  and Rhythm: Normal rate.  Pulmonary:     Effort: Pulmonary effort is normal. No respiratory distress.  Skin:    General: Skin is warm and dry.  Neurological:     General: No focal deficit present.     Mental Status: She is alert.  Psychiatric:        Mood and Affect: Mood normal.      UC Treatments / Results  Labs (all labs ordered are listed, but only abnormal results are displayed) Labs Reviewed  POCT URINALYSIS DIP (MANUAL ENTRY) - Abnormal; Notable for the following components:      Result Value   Color, UA red (*)    Clarity, UA cloudy (*)    Bilirubin, UA moderate (*)    Ketones, POC UA small (15) (*)    Blood, UA large (*)    Protein Ur, POC >=300 (*)    Urobilinogen, UA 2.0 (*)    Nitrite, UA Positive (*)    Leukocytes, UA Large (3+) (*)    All other components within normal limits  POCT URINE PREGNANCY    EKG   Radiology No results found.  Procedures Procedures (including critical care time)  Medications Ordered in UC Medications - No data to display  Initial Impression / Assessment and Plan / UC Course  I have reviewed the triage vital signs and the nursing notes.  Pertinent labs & imaging results that were available during my care of the patient were reviewed by me and considered in my medical decision making (see chart for details).  Vitals and triage reviewed, patient appears hemodynamically stable.  Urine is red, with bilirubin, ketones, red blood cells, protein, urobilinogen, nitrates and leukocytes.  Suspicious for UTI, however, I do not feel as this is the root of her heavy, prolonged vaginal bleeding.  She is symptomatic with dizziness and excess pad wetting, discussed we could treat this outpatient with more Provera, she has already failed this outpatient, and OB/GYN follow-up within the next few weeks.  Concern over symptomatic dizziness with blood loss, patient opted to head to the emergency department for further evaluation.  I agree with this  plan, patient stable to transport via POV, husband to drive to ED.      Final Clinical Impressions(s) / UC Diagnoses   Final diagnoses:  Vaginal bleeding   Discharge Instructions   None    ED Prescriptions   None    PDMP not reviewed this encounter.   Shafter Jupin, [place] N, [place] 09/30/23 [phone]

## 2023-09-30 NOTE — ED Provider Notes (Signed)
  [place] EMERGENCY DEPARTMENT AT [facility] Provider Note   CSN: [id number] Arrival date & time: 09/30/23  1902     History {Add pertinent medical, surgical, social history, OB history to HPI:1} Chief Complaint  Patient presents with   Abdominal Pain    [name] is a 38 y.o. female.   Abdominal Pain      Home Medications Prior to Admission medications   Medication Sig Start Date End Date Taking? Authorizing Provider  ibuprofen (ADVIL) 600 MG tablet Take 1 tablet (600 mg total) by mouth every 6 (six) hours as needed. 02/14/21   Domenick Gong, MD  medroxyPROGESTERone (PROVERA) 10 MG tablet Take 1 tablet (10 mg total) by mouth daily. 09/18/23   Sue Lush, FNP      Allergies    Other    Review of Systems   Review of Systems  Gastrointestinal:  Positive for abdominal pain.    Physical Exam Updated Vital Signs BP (!) 122/92 (BP Location: Left Arm)   Pulse 77   Temp 97.7 F (36.5 C)   Resp 17   Ht 5\' 7"  (1.702 m)   Wt 124.7 kg   SpO2 100%   BMI 43.07 kg/m  Physical Exam  ED Results / Procedures / Treatments   Labs (all labs ordered are listed, but only abnormal results are displayed) Labs Reviewed  COMPREHENSIVE METABOLIC PANEL - Abnormal; Notable for the following components:      Result Value   Total Bilirubin 1.3 (*)    All other components within normal limits  CBC - Abnormal; Notable for the following components:   Hemoglobin 11.3 (*)    HCT 34.3 (*)    All other components within normal limits  LIPASE, BLOOD  HCG, SERUM, QUALITATIVE  URINALYSIS, ROUTINE W REFLEX MICROSCOPIC    EKG None  Radiology No results found.  Procedures Procedures  {Document cardiac monitor, telemetry assessment procedure when appropriate:1}  Medications Ordered in ED Medications - No data to display  ED Course/ Medical Decision Making/ A&P   {   Click here for ABCD2, HEART and other calculatorsREFRESH Note before signing :1}                               Medical Decision Making Amount and/or Complexity of Data Reviewed Labs: ordered.   ***  {Document critical care time when appropriate:1} {Document review of labs and clinical decision tools ie heart score, Chads2Vasc2 etc:1}  {Document your independent review of radiology images, and any outside records:1} {Document your discussion with family members, caretakers, and with consultants:1} {Document social determinants of health affecting pt's care:1} {Document your decision making why or why not admission, treatments were needed:1} Final Clinical Impression(s) / ED Diagnoses Final diagnoses:  None    Rx / DC Orders ED Discharge Orders     None

## 2023-10-01 ENCOUNTER — Emergency Department (HOSPITAL_COMMUNITY)

## 2023-10-01 LAB — URINALYSIS, ROUTINE W REFLEX MICROSCOPIC
Bilirubin Urine: NEGATIVE
Glucose, UA: NEGATIVE mg/dL
Ketones, ur: NEGATIVE mg/dL
Nitrite: NEGATIVE
Protein, ur: 100 mg/dL — AB
RBC / HPF: >50 RBC/hpf (ref 0–5)
Specific Gravity, Urine: 1.02 (ref 1.005–1.030)
pH: 8 (ref 5.0–8.0)

## 2023-10-01 MED ORDER — NAPROXEN 500 MG PO TABS: 500.0000 mg | ORAL_TABLET | Freq: Two times a day (BID) | ORAL | 0 refills | Status: AC

## 2023-10-01 MED ORDER — CEPHALEXIN 500 MG PO CAPS: 500.0000 mg | ORAL_CAPSULE | Freq: Four times a day (QID) | ORAL | 0 refills | Status: AC

## 2023-10-01 MED ORDER — MEGESTROL ACETATE 40 MG PO TABS: 40.0000 mg | ORAL_TABLET | Freq: Two times a day (BID) | ORAL | 0 refills | Status: AC | PRN

## 2023-10-01 NOTE — Discharge Instructions (Addendum)
 Take Megace 40 mg once a day.  You can take it twice a day if bleeding does not improve with once a day dose. Take Naproxen twice a day as needed for abdominal pain.  Take Keflex 4 times a day for the neck 7 days for UTI.  Your gynecology office will call you to schedule an appointment for next week.  Get help right away if: You faint. You have to change pads every hour. You have pain in your belly. You have a fever or chills. You get sweaty or weak. You pass large blood clots from your vagina.

## 2023-10-20 ENCOUNTER — Emergency Department (HOSPITAL_COMMUNITY)
Admission: EM | Admit: 2023-10-20 | Discharge: 2023-10-21 | Disposition: A | Discharge status: Home / Self Care | Attending: Emergency Medicine | Admitting: Emergency Medicine

## 2023-10-20 ENCOUNTER — Other Ambulatory Visit: Payer: Self-pay

## 2023-10-20 DIAGNOSIS — N939 Abnormal uterine and vaginal bleeding, unspecified: Secondary | ICD-10-CM | POA: Insufficient documentation

## 2023-10-20 DIAGNOSIS — Z975 Presence of (intrauterine) contraceptive device: Secondary | ICD-10-CM | POA: Diagnosis not present

## 2023-10-20 DIAGNOSIS — N921 Excessive and frequent menstruation with irregular cycle: Secondary | ICD-10-CM | POA: Diagnosis not present

## 2023-10-20 LAB — COMPREHENSIVE METABOLIC PANEL WITH GFR
ALT: 22 U/L (ref 0–44)
AST: 19 U/L (ref 15–41)
Albumin: 3.9 g/dL (ref 3.5–5.0)
Alkaline Phosphatase: 65 U/L (ref 38–126)
Anion gap: 7 (ref 5–15)
BUN: 7 mg/dL (ref 6–20)
CO2: 21 mmol/L — ABNORMAL LOW (ref 22–32)
Calcium: 9 mg/dL (ref 8.9–10.3)
Chloride: 109 mmol/L (ref 98–111)
Creatinine, Ser: 0.56 mg/dL (ref 0.44–1.00)
GFR, Estimated: >60 mL/min (ref >60)
Glucose, Bld: 102 mg/dL — ABNORMAL HIGH (ref 70–99)
Potassium: 3.6 mmol/L (ref 3.5–5.1)
Sodium: 137 mmol/L (ref 135–145)
Total Bilirubin: 0.7 mg/dL (ref 0.0–1.2)
Total Protein: 7.8 g/dL (ref 6.5–8.1)

## 2023-10-20 LAB — CBC
HCT: 32 % — ABNORMAL LOW (ref 36.0–46.0)
Hemoglobin: 10.3 g/dL — ABNORMAL LOW (ref 12.0–15.0)
MCH: 27.6 pg (ref 26.0–34.0)
MCHC: 32.2 g/dL (ref 30.0–36.0)
MCV: 85.8 fL (ref 80.0–100.0)
Platelets: 489 10*3/uL — ABNORMAL HIGH (ref 150–400)
RBC: 3.73 MIL/uL — ABNORMAL LOW (ref 3.87–5.11)
RDW: 13 % (ref 11.5–15.5)
WBC: 6.9 10*3/uL (ref 4.0–10.5)
nRBC: 0 % (ref 0.0–0.2)

## 2023-10-20 LAB — HCG, SERUM, QUALITATIVE: Preg, Serum: NEGATIVE

## 2023-10-20 NOTE — ED Triage Notes (Signed)
 Pt. Having vaginal bleeding since February.  Pt. Had her IUD removed in December 27th and has had bleeding since then and then  new IUD was placed in January 15th, bleeding has started again.  Pt., was on medication that helped to slow down the bleeding but when she stopped it the bleeding returned. The pt. Reports bleeding for over 2 months.  The bleeding has become worse and she is changing her pads every 15 minutes . She reports clots with periods of blood gushing. She would like the IUD removed.   Skin warm and dry.

## 2023-10-20 NOTE — ED Provider Notes (Signed)
  [place] EMERGENCY DEPARTMENT AT [facility] Provider Note   CSN: [id number] Arrival date & time: 10/20/23  1252     History {Add pertinent medical, surgical, social history, OB history to HPI:1} Chief Complaint  Patient presents with   Vaginal Bleeding    [name] is a 38 y.o. female.   Vaginal Bleeding      Home Medications Prior to Admission medications   Medication Sig Start Date End Date Taking? Authorizing Provider  ibuprofen (ADVIL) 600 MG tablet Take 1 tablet (600 mg total) by mouth every 6 (six) hours as needed. 02/14/21   Mortenson, [name], MD  medroxyPROGESTERone (PROVERA) 10 MG tablet Take 1 tablet (10 mg total) by mouth daily. 09/18/23   Zelma Hidden, FNP      Allergies    Other    Review of Systems   Review of Systems  Genitourinary:  Positive for vaginal bleeding.    Physical Exam Updated Vital Signs BP 115/75 (BP Location: Left Arm)   Pulse 82   Temp 98.4 F (36.9 C) (Oral)   Resp 18   Ht 5\' 7"  (1.702 m)   Wt 124.7 kg   SpO2 96%   BMI 43.06 kg/m  Physical Exam  ED Results / Procedures / Treatments   Labs (all labs ordered are listed, but only abnormal results are displayed) Labs Reviewed  CBC - Abnormal; Notable for the following components:      Result Value   RBC 3.73 (*)    Hemoglobin 10.3 (*)    HCT 32.0 (*)    Platelets 489 (*)    All other components within normal limits  COMPREHENSIVE METABOLIC PANEL WITH GFR - Abnormal; Notable for the following components:   CO2 21 (*)    Glucose, Bld 102 (*)    All other components within normal limits  HCG, SERUM, QUALITATIVE  URINALYSIS, ROUTINE W REFLEX MICROSCOPIC    EKG None  Radiology No results found.  Procedures Procedures  {Document cardiac monitor, telemetry assessment procedure when appropriate:1}  Medications Ordered in ED Medications - No data to display  ED Course/ Medical Decision Making/ A&P   {   Click here for ABCD2, HEART and  other calculatorsREFRESH Note before signing :1}                              Medical Decision Making Amount and/or Complexity of Data Reviewed Labs: ordered.   ***  {Document critical care time when appropriate:1} {Document review of labs and clinical decision tools ie heart score, Chads2Vasc2 etc:1}  {Document your independent review of radiology images, and any outside records:1} {Document your discussion with family members, caretakers, and with consultants:1} {Document social determinants of health affecting pt's care:1} {Document your decision making why or why not admission, treatments were needed:1} Final Clinical Impression(s) / ED Diagnoses Final diagnoses:  None    Rx / DC Orders ED Discharge Orders     None

## 2023-10-20 NOTE — ED Triage Notes (Signed)
 Pt. Also reports having abdominal cramping.

## 2023-10-20 NOTE — ED Provider Triage Note (Signed)
 Emergency Medicine Provider Triage Evaluation Note  [name] , a 38 y.o. female  was evaluated in triage.  Pt complains of vaginal bleeding, pelvic pain. Had IUD placed on January 15, on February 15 started bleeding. Bleeding is persistent and severe. Requesting to have her IUD removed. No shortness of breath or weakness. Pain is generalized cramping throughout the lower abdomen.  Review of Systems  Positive:  Negative:   Physical Exam  BP 117/86 (BP Location: Left Arm)   Pulse 87   Temp 98.2 F (36.8 C)   Resp 16   Ht 5\' 7"  (1.702 m)   Wt 124.7 kg   SpO2 100%   BMI 43.06 kg/m  Gen:   Awake, no distress   Resp:  Normal effort  MSK:   Moves extremities without difficulty  Other:    Medical Decision Making  Medically screening exam initiated at 2:34 PM.  Appropriate orders placed.  Aixa Vanvalkenburgh was informed that the remainder of the evaluation will be completed by another provider, this initial triage assessment does not replace that evaluation, and the importance of remaining in the ED until their evaluation is complete.     Sherra Dk, PA-C 10/20/23 1436

## 2023-10-21 DIAGNOSIS — Z975 Presence of (intrauterine) contraceptive device: Secondary | ICD-10-CM

## 2023-10-21 DIAGNOSIS — N921 Excessive and frequent menstruation with irregular cycle: Secondary | ICD-10-CM

## 2023-10-21 LAB — URINALYSIS, ROUTINE W REFLEX MICROSCOPIC
Bilirubin Urine: NEGATIVE
Glucose, UA: NEGATIVE mg/dL
Ketones, ur: NEGATIVE mg/dL
Nitrite: NEGATIVE
Protein, ur: 100 mg/dL — AB
RBC / HPF: >50 RBC/hpf (ref 0–5)
Specific Gravity, Urine: 1.027 (ref 1.005–1.030)
pH: 8 (ref 5.0–8.0)

## 2023-10-21 MED ORDER — KETOROLAC TROMETHAMINE 15 MG/ML IJ SOLN: 15.0000 mg | Freq: Once | INTRAMUSCULAR | Status: DC

## 2023-10-21 MED ORDER — MEGESTROL ACETATE 40 MG PO TABS: 80.0000 mg | ORAL_TABLET | Freq: Two times a day (BID) | ORAL | 1 refills | Status: AC

## 2023-10-21 MED ORDER — MEGESTROL ACETATE 40 MG PO TABS
80.0000 mg | ORAL_TABLET | Freq: Once | ORAL | Status: AC
  Administered 2023-10-21: 80 mg via ORAL
  Filled 2023-10-21: qty 1
  Filled 2023-10-21: qty 2

## 2023-10-21 NOTE — Progress Notes (Signed)
   OB/GYN Telephone Consult  10/21/2023   [name] is a 38 y.o. [id number] who is currently not pregnant presenting to [facility] ER.   I was called for a consult regarding the care of this patient by the PA caring for the patient.   The provider had the following clinical question: Pt seen multiple times for heavy bleeding with IUD in place, management options.  The provider presented the following relevant clinical information: What can be done to address the heavy bleeding since she has tried sveral medications.  I performed a chart review on the patient and reviewed available documentation.  BP 121/74   Pulse 80   Temp 98.1 F (36.7 C)   Resp 16   Ht 5\' 7"  (1.702 m)   Wt 124.7 kg   SpO2 97%   BMI 43.06 kg/m   Exam- performed by consulting provider   Recommendations:  -megace 80 mg po BID  -u/s suggest device in the lower uterine segment, consider removal -office follow up in the next 5-7 days -Recommended MD/APP provide the patient with a referral to the Center for [facility] (any office) for follow up in  1 week.   Thank you for this consult and if additional recommendations are needed please call [phone] for the OB/GYN attending on service at [facility].   I spent approximately 10 minutes directly consulting with the provider and verbally discussing this case. Additionally 15 minutes minutes was spent performing chart review and documentation.    Abigail Abler, MD   Criteria for phone consult billing? (If answer to any of these are yes then you cannot bill this telephone consult) Will the patient be seen urgently (within 24hrs) at a [facility] practice? No Is this a patient on which I performed surgery within the last 7d? No Have you billed a telephone consult on this patient in the last 7d? No    CPT Code (based on total time, direct and indirect) Interprofessional Telephone/ Internet/ Electronic Health Record consultations:   99446  (5-47minutes) 99447 (11-12 minutes) 99448 (21-30 minutes), 99449 (31 minutes or more)

## 2023-10-21 NOTE — ED Notes (Signed)
 Patient discharged in stable condition.

## 2023-10-21 NOTE — Discharge Instructions (Addendum)
 You will need to follow-up with your OB/GYN, return to the ER if you have any shortness of breath, severe abdominal pain, or confusion.

## 2023-11-11 ENCOUNTER — Ambulatory Visit: Admitting: Obstetrics and Gynecology

## 2023-11-11 ENCOUNTER — Other Ambulatory Visit: Payer: Self-pay

## 2023-11-11 ENCOUNTER — Encounter: Payer: Self-pay | Admitting: Obstetrics and Gynecology

## 2023-11-11 VITALS — BP 126/85 | HR 86 | Ht 67.0 in | Wt 284.4 lb

## 2023-11-11 DIAGNOSIS — D500 Iron deficiency anemia secondary to blood loss (chronic): Secondary | ICD-10-CM | POA: Diagnosis not present

## 2023-11-11 DIAGNOSIS — N920 Excessive and frequent menstruation with regular cycle: Secondary | ICD-10-CM | POA: Diagnosis not present

## 2023-11-11 LAB — CBC
Hematocrit: 29.4 % — ABNORMAL LOW (ref 34.0–46.6)
Hemoglobin: 9.3 g/dL — ABNORMAL LOW (ref 11.1–15.9)
MCH: 25.8 pg — ABNORMAL LOW (ref 26.6–33.0)
MCHC: 31.6 g/dL (ref 31.5–35.7)
MCV: 82 fL (ref 79–97)
Platelets: 483 10*3/uL — ABNORMAL HIGH (ref 150–450)
RBC: 3.6 x10E6/uL — ABNORMAL LOW (ref 3.77–5.28)
RDW: 13.2 % (ref 11.7–15.4)
WBC: 5.8 10*3/uL (ref 3.4–10.8)

## 2023-11-11 NOTE — Patient Instructions (Signed)
Blood builder

## 2023-11-11 NOTE — Progress Notes (Signed)
 GYNECOLOGY VISIT  Patient name: [name] MRN [id number]  Date of birth: 08/15/1985 Chief Complaint:   Establish Care (Has been bleeding for 3 months, has Periguard IUD placed, Only when not bleeding when is when sleeping but theres a gush of blood once she stands up after rest. Was given Megestrol  from ER to help but didn't work. Bleed through treatment even when doubling the dose )   History:  [name] is a 38 y.o. [id number] being seen today for AUB w/ IUD.   Discussed the use of AI scribe software for clinical note transcription with the patient, who gave verbal consent to proceed.  History of Present Illness [name] is a 38 year old female who presents with heavy menstrual bleeding associated with ParaGard  IUD use.  She has experienced heavy menstrual bleeding since the insertion of the ParaGard  IUD in 2012. Her menstrual periods last approximately eight days, with the first five days being heavy and the last two days lighter. After six years of use, she encountered issues with the ParaGard , leading to its removal and subsequent pregnancy with her last child.  Recently, she has experienced increased bleeding and dizziness, resulting in two emergency room visits. She describes episodes of dizziness and has had a fall due to the bleeding. She is not currently taking any iron supplements despite a recent low blood count. She notes that bleeding ceases during sleep but resumes heavily upon waking.  She has never used a hormonal IUD and is concerned about potential weight gain associated with hormonal contraceptives. She previously used birth control pills after her first child in 2007 but discontinued them due to weight gain and anxiety.  There is no history of fibroids, as confirmed by previous evaluations. She is concerned about the possibility of the ParaGard  being positioned low, which might be contributing to the bleeding. She has used three ParaGard  IUDs in  total without prior issues until now.     Past Medical History:  Diagnosis Date  . Back pain   . Fetal macrosomia 04/06/2017     Largest prior was 9lbs 8oz.  . Gestational diabetes    Meds & diet controlled  . History of cesarean delivery 04/30/2017    Past Surgical History:  Procedure Laterality Date  . CESAREAN SECTION    . CESAREAN SECTION N/A 04/29/2017   Procedure: CESAREAN SECTION;  Surgeon: Abner Ables, MD;  Location: [facility];  Service: Obstetrics;  Laterality: N/A;  . CHOLECYSTECTOMY    . WISDOM TOOTH EXTRACTION  2019    The following portions of the patient's history were reviewed and updated as appropriate: allergies, current medications, past family history, past medical history, past social history, past surgical history and problem list.   Health Maintenance:   Last pap     Component Value Date/Time   DIAGPAP  10/16/2020 1517    - Negative for intraepithelial lesion or malignancy (NILM)   DIAGPAP  06/10/2017 0000    NEGATIVE FOR INTRAEPITHELIAL LESIONS OR MALIGNANCY.   HPVHIGH Negative 10/16/2020 1517   ADEQPAP  10/16/2020 1517    Satisfactory for evaluation; transformation zone component PRESENT.   ADEQPAP  06/10/2017 0000    Satisfactory for evaluation  endocervical/transformation zone component PRESENT.    High Risk HPV: Positive  Adequacy:  Satisfactory for evaluation, transformation zone component PRESENT  Diagnosis:  Atypical squamous cells of undetermined significance (ASC-US )  Last mammogram: n/a   Review of Systems:  Pertinent items are noted in HPI. Comprehensive  review of systems was otherwise negative.   Objective:  Physical Exam BP 126/85 (BP Location: Left Arm, Patient Position: Sitting, Cuff Size: Large)   Pulse 86   Ht 5\' 7"  (1.702 m)   Wt 284 lb 6.4 oz (129 kg)   LMP  (Approximate)   SpO2 100%   BMI 44.54 kg/m    Physical Exam Vitals and nursing note reviewed. Exam conducted with a chaperone present.   Constitutional:      Appearance: Normal appearance.  HENT:     Head: Normocephalic and atraumatic.  Pulmonary:     Effort: Pulmonary effort is normal.     Breath sounds: Normal breath sounds.  Genitourinary:    General: Normal vulva.     Exam position: Lithotomy position.     Vagina: Normal.     Cervix: Normal.  Skin:    General: Skin is warm and dry.  Neurological:     General: No focal deficit present.     Mental Status: She is alert.  Psychiatric:        Mood and Affect: Mood normal.        Behavior: Behavior normal.        Thought Content: Thought content normal.        Judgment: Judgment normal.     IUD Removal  Patient identified, informed consent performed, consent signed.  Patient was in the dorsal lithotomy position, normal external genitalia was noted.  A speculum was placed in the patient's vagina, normal discharge was noted, no lesions. The cervix was visualized, no lesions, no abnormal discharge.  The strings of the IUD were visualized, grasped and pulled using ring forceps. The IUD was removed in its entirety. . Patient tolerated the procedure well.        Assessment & Plan:   Assessment & Plan Heavy menstrual bleeding due to ParaGard  IUD Heavy menstrual bleeding linked to ParaGard  IUD, causing dizziness and ER visits. She opted for IUD removal. Discussed alternative contraceptives, including hormonal IUDs like Mirena and Liletta, and other methods such as pills, patch, ring, and injections. - Now s/p uncomplicated removal of ParaGard  IUD. - Discuss alternative contraceptive options, including hormonal IUDs. - Patient elects to use barrier methods for now and may return at a later time for a new IUD  Iron deficiency anemia Iron deficiency anemia likely due to heavy menstrual bleeding. Emphasized importance of iron supplementation and potential need for iron transfusion if levels are low. - Recommend over-the-counter iron supplementation. - Check current iron  levels. - Consider iron transfusion if iron levels are significantly low.     Does not want iron transfusion and would prefer po iron supplementation  *** Routine preventative health maintenance measures emphasized.  Katie Pelton, MD Minimally Invasive Gynecologic Surgery [facility], [facility]

## 2023-11-12 ENCOUNTER — Encounter: Payer: Self-pay | Admitting: Obstetrics and Gynecology

## 2023-11-12 ENCOUNTER — Ambulatory Visit: Admitting: Family Medicine

## 2023-11-13 ENCOUNTER — Other Ambulatory Visit: Payer: Self-pay | Admitting: Obstetrics and Gynecology

## 2023-11-13 ENCOUNTER — Telehealth: Payer: Self-pay

## 2023-11-13 ENCOUNTER — Encounter: Payer: Self-pay | Admitting: Obstetrics and Gynecology

## 2023-11-13 DIAGNOSIS — D500 Iron deficiency anemia secondary to blood loss (chronic): Secondary | ICD-10-CM

## 2023-11-13 DIAGNOSIS — D509 Iron deficiency anemia, unspecified: Secondary | ICD-10-CM | POA: Insufficient documentation

## 2023-11-13 DIAGNOSIS — D649 Anemia, unspecified: Secondary | ICD-10-CM | POA: Insufficient documentation

## 2023-11-13 NOTE — Telephone Encounter (Signed)
 Dr. Elester Grim, patient will be scheduled as soon as possible.  Auth Submission: NO AUTH NEEDED Site of care: Site of care: [facility] Payer: [employer] Medication & CPT/J Code(s) submitted: Venofer (Iron Sucrose) J1756 Route of submission (phone, fax, portal):  Phone # Fax # Auth type: Buy/Bill PB Units/visits requested: 200mg  x 5 doses Reference number:  Approval from: 11/13/23 to 04/14/24

## 2023-11-17 ENCOUNTER — Telehealth: Payer: Self-pay

## 2023-11-17 ENCOUNTER — Other Ambulatory Visit: Payer: Self-pay | Admitting: Obstetrics and Gynecology

## 2023-11-17 ENCOUNTER — Ambulatory Visit (INDEPENDENT_AMBULATORY_CARE_PROVIDER_SITE_OTHER)

## 2023-11-17 VITALS — BP 109/70 | HR 79 | Temp 97.8°F | Resp 16 | Ht 67.0 in | Wt 283.4 lb

## 2023-11-17 DIAGNOSIS — D500 Iron deficiency anemia secondary to blood loss (chronic): Secondary | ICD-10-CM | POA: Diagnosis not present

## 2023-11-17 DIAGNOSIS — N920 Excessive and frequent menstruation with regular cycle: Secondary | ICD-10-CM

## 2023-11-17 DIAGNOSIS — D509 Iron deficiency anemia, unspecified: Secondary | ICD-10-CM

## 2023-11-17 MED ORDER — IRON SUCROSE 20 MG/ML IV SOLN
200.0000 mg | Freq: Once | INTRAVENOUS | Status: AC
Start: 2023-11-17 — End: 2023-11-17
  Administered 2023-11-17: 200 mg via INTRAVENOUS
  Filled 2023-11-17: qty 10

## 2023-11-17 NOTE — Telephone Encounter (Signed)
 Dr. Elester Grim, patient will be scheduled as soon as possible.  Auth Submission: NO AUTH NEEDED Site of care: Site of care: [facility] Payer: [employer] [place] [employer] Medication & CPT/J Code(s) submitted: Venofer (Iron Sucrose) J1756 Route of submission (phone, fax, portal):  Phone # Fax # Auth type: Buy/Bill PB Units/visits requested: 300mg  x 2 doses Reference number:  Approval from: 11/17/23 to 04/18/24

## 2023-11-17 NOTE — Progress Notes (Signed)
 Diagnosis: Iron Deficiency Anemia  Provider:  Praveen Mannam MD  Procedure: IV Push  IV Type: Peripheral, IV Location: R Hand  Venofer (Iron Sucrose), Dose: 200 mg  Post Infusion IV Care: Observation period completed  Discharge: Condition: Good, Destination: Home . AVS Declined  Performed by:  Natividad Balding, RN

## 2023-11-19 ENCOUNTER — Ambulatory Visit

## 2023-11-25 ENCOUNTER — Ambulatory Visit (INDEPENDENT_AMBULATORY_CARE_PROVIDER_SITE_OTHER)

## 2023-11-25 ENCOUNTER — Ambulatory Visit

## 2023-11-25 VITALS — BP 125/84 | HR 74 | Temp 98.0°F | Resp 16 | Ht 67.0 in | Wt 287.0 lb

## 2023-11-25 DIAGNOSIS — D500 Iron deficiency anemia secondary to blood loss (chronic): Secondary | ICD-10-CM | POA: Diagnosis not present

## 2023-11-25 DIAGNOSIS — N920 Excessive and frequent menstruation with regular cycle: Secondary | ICD-10-CM

## 2023-11-25 DIAGNOSIS — D509 Iron deficiency anemia, unspecified: Secondary | ICD-10-CM

## 2023-11-25 MED ORDER — SODIUM CHLORIDE 0.9 % IV SOLN
300.0000 mg | Freq: Once | INTRAVENOUS | Status: AC
  Administered 2023-11-25: 300 mg via INTRAVENOUS
  Filled 2023-11-25: qty 15

## 2023-11-25 MED ORDER — IRON SUCROSE 300 MG IVPB - SIMPLE MED: 300.0000 mg | Freq: Once | Status: DC

## 2023-11-25 NOTE — Progress Notes (Signed)
 Diagnosis: Iron  Deficiency Anemia  Provider:  Praveen Mannam MD  Procedure: IV Infusion  IV Type: Peripheral, IV Location: R Forearm  Venofer  (Iron  Sucrose), Dose: 300 mg  Infusion Start Time: 0935  Infusion Stop Time: 1123  Post Infusion IV Care: Patient declined observation and Peripheral IV Discontinued  Discharge: Condition: Good, Destination: Home . AVS Declined  Performed by:  Marcellas Marchant, RN

## 2023-11-27 ENCOUNTER — Ambulatory Visit

## 2023-12-01 ENCOUNTER — Ambulatory Visit: Admitting: Obstetrics and Gynecology

## 2023-12-08 ENCOUNTER — Ambulatory Visit

## 2023-12-09 ENCOUNTER — Ambulatory Visit

## 2023-12-09 VITALS — BP 123/77 | HR 77 | Temp 97.9°F | Resp 18 | Ht 67.0 in | Wt 287.6 lb

## 2023-12-09 DIAGNOSIS — D500 Iron deficiency anemia secondary to blood loss (chronic): Secondary | ICD-10-CM | POA: Diagnosis not present

## 2023-12-09 DIAGNOSIS — D509 Iron deficiency anemia, unspecified: Secondary | ICD-10-CM

## 2023-12-09 DIAGNOSIS — N920 Excessive and frequent menstruation with regular cycle: Secondary | ICD-10-CM | POA: Diagnosis not present

## 2023-12-09 MED ORDER — SODIUM CHLORIDE 0.9 % IV SOLN
300.0000 mg | Freq: Once | INTRAVENOUS | Status: AC
  Administered 2023-12-09: 300 mg via INTRAVENOUS
  Filled 2023-12-09: qty 15

## 2023-12-09 NOTE — Progress Notes (Signed)
 Diagnosis: Iron  Deficiency Anemia  Provider:  Praveen Mannam MD  Procedure: IV Infusion  IV Type: Peripheral, IV Location: R Forearm  Venofer  (Iron  Sucrose), Dose: 300 mg  Infusion Start Time: 1143  Infusion Stop Time: 1337  Post Infusion IV Care: Patient declined observation and Peripheral IV Discontinued  Discharge: Condition: Good, Destination: Home . AVS Declined  Performed by:  Corena Tilson, RN
